# Patient Record
Sex: Male | Born: 1960 | Race: White | Hispanic: No | Marital: Single | Smoking: Never smoker
Health system: Southern US, Community
[De-identification: ages and names within clinical notes are randomized; demographics above are authoritative.]

## PROBLEM LIST (undated history)

## (undated) DIAGNOSIS — M549 Dorsalgia, unspecified: Secondary | ICD-10-CM

## (undated) DIAGNOSIS — M5126 Other intervertebral disc displacement, lumbar region: Secondary | ICD-10-CM

## (undated) DIAGNOSIS — M5136 Other intervertebral disc degeneration, lumbar region: Secondary | ICD-10-CM

## (undated) DIAGNOSIS — M51369 Other intervertebral disc degeneration, lumbar region without mention of lumbar back pain or lower extremity pain: Secondary | ICD-10-CM

## (undated) DIAGNOSIS — R55 Syncope and collapse: Secondary | ICD-10-CM

## (undated) DIAGNOSIS — F1410 Cocaine abuse, uncomplicated: Secondary | ICD-10-CM

## (undated) DIAGNOSIS — G8929 Other chronic pain: Secondary | ICD-10-CM

## (undated) DIAGNOSIS — S32009A Unspecified fracture of unspecified lumbar vertebra, initial encounter for closed fracture: Secondary | ICD-10-CM

## (undated) HISTORY — PX: CHOLECYSTECTOMY: SHX55

## (undated) HISTORY — PX: FRACTURE SURGERY: SHX138

## (undated) HISTORY — PX: ARM WOUND REPAIR / CLOSURE: SUR1141

## (undated) HISTORY — DX: Syncope and collapse: R55

---

## 2002-04-03 ENCOUNTER — Emergency Department (HOSPITAL_COMMUNITY): Admission: AC | Admit: 2002-04-03 | Discharge: 2002-04-03 | Payer: Self-pay

## 2002-04-03 ENCOUNTER — Encounter: Payer: Self-pay | Admitting: Emergency Medicine

## 2004-06-30 ENCOUNTER — Emergency Department: Payer: Self-pay | Admitting: General Practice

## 2004-07-10 ENCOUNTER — Ambulatory Visit: Payer: Self-pay | Admitting: Physician Assistant

## 2004-07-25 ENCOUNTER — Encounter: Payer: Self-pay | Admitting: Unknown Physician Specialty

## 2004-08-01 ENCOUNTER — Encounter: Payer: Self-pay | Admitting: Unknown Physician Specialty

## 2004-08-29 ENCOUNTER — Encounter: Payer: Self-pay | Admitting: Unknown Physician Specialty

## 2004-09-21 ENCOUNTER — Emergency Department: Payer: Self-pay | Admitting: Internal Medicine

## 2004-09-29 ENCOUNTER — Encounter: Payer: Self-pay | Admitting: Unknown Physician Specialty

## 2006-05-11 ENCOUNTER — Emergency Department: Payer: Self-pay | Admitting: Unknown Physician Specialty

## 2006-08-31 ENCOUNTER — Emergency Department: Payer: Self-pay | Admitting: Emergency Medicine

## 2008-04-07 ENCOUNTER — Emergency Department: Payer: Self-pay | Admitting: Emergency Medicine

## 2008-04-26 ENCOUNTER — Emergency Department: Payer: Self-pay | Admitting: Emergency Medicine

## 2008-05-10 ENCOUNTER — Ambulatory Visit: Payer: Self-pay | Admitting: Specialist

## 2008-10-06 ENCOUNTER — Emergency Department: Payer: Self-pay | Admitting: Emergency Medicine

## 2010-05-16 ENCOUNTER — Emergency Department: Payer: Self-pay | Admitting: Emergency Medicine

## 2010-05-26 ENCOUNTER — Emergency Department: Payer: Self-pay | Admitting: Unknown Physician Specialty

## 2010-06-21 ENCOUNTER — Emergency Department: Payer: Self-pay | Admitting: Emergency Medicine

## 2010-08-13 ENCOUNTER — Emergency Department: Payer: Self-pay | Admitting: Emergency Medicine

## 2010-08-22 ENCOUNTER — Ambulatory Visit: Payer: Self-pay | Admitting: Surgery

## 2010-08-24 ENCOUNTER — Ambulatory Visit: Payer: Self-pay | Admitting: Surgery

## 2010-08-28 LAB — PATHOLOGY REPORT

## 2010-11-11 ENCOUNTER — Emergency Department: Payer: Self-pay | Admitting: Emergency Medicine

## 2011-04-23 ENCOUNTER — Emergency Department: Payer: Self-pay | Admitting: Emergency Medicine

## 2011-06-01 ENCOUNTER — Emergency Department: Payer: Self-pay | Admitting: *Deleted

## 2011-07-21 ENCOUNTER — Emergency Department: Payer: Self-pay | Admitting: Emergency Medicine

## 2011-07-21 LAB — CBC
HCT: 42.4 % (ref 40.0–52.0)
HGB: 14.4 g/dL (ref 13.0–18.0)
MCH: 31.6 pg (ref 26.0–34.0)
MCHC: 33.9 g/dL (ref 32.0–36.0)
MCV: 93 fL (ref 80–100)
Platelet: 158 10*3/uL (ref 150–440)
RBC: 4.56 10*6/uL (ref 4.40–5.90)
RDW: 12.6 % (ref 11.5–14.5)
WBC: 6.4 10*3/uL (ref 3.8–10.6)

## 2011-07-21 LAB — BASIC METABOLIC PANEL
Anion Gap: 12 (ref 7–16)
BUN: 22 mg/dL — ABNORMAL HIGH (ref 7–18)
Calcium, Total: 8.9 mg/dL (ref 8.5–10.1)
Chloride: 107 mmol/L (ref 98–107)
Co2: 27 mmol/L (ref 21–32)
Creatinine: 1.31 mg/dL — ABNORMAL HIGH (ref 0.60–1.30)
EGFR (African American): >60
EGFR (Non-African Amer.): >60
Glucose: 117 mg/dL — ABNORMAL HIGH (ref 65–99)
Osmolality: 295 (ref 275–301)
Potassium: 4 mmol/L (ref 3.5–5.1)
Sodium: 146 mmol/L — ABNORMAL HIGH (ref 136–145)

## 2011-07-21 LAB — TROPONIN I
Troponin-I: <0.02 ng/mL
Troponin-I: <0.02 ng/mL

## 2012-04-14 ENCOUNTER — Emergency Department: Payer: Self-pay | Admitting: Emergency Medicine

## 2012-06-26 ENCOUNTER — Emergency Department: Payer: Self-pay | Admitting: Emergency Medicine

## 2012-09-21 ENCOUNTER — Emergency Department: Payer: Self-pay | Admitting: Emergency Medicine

## 2013-02-19 ENCOUNTER — Inpatient Hospital Stay: Payer: Self-pay | Admitting: Internal Medicine

## 2013-02-19 LAB — URINALYSIS, COMPLETE
Bacteria: NONE SEEN
Blood: NEGATIVE
Glucose,UR: NEGATIVE mg/dL (ref 0–75)
Hyaline Cast: 116
Ketone: NEGATIVE
Leukocyte Esterase: NEGATIVE
Nitrite: NEGATIVE
Ph: 5 (ref 4.5–8.0)
Protein: 25
RBC,UR: 12 /HPF (ref 0–5)
Specific Gravity: 1.03 (ref 1.003–1.030)
Squamous Epithelial: 4
WBC UR: 3 /HPF (ref 0–5)

## 2013-02-19 LAB — TROPONIN I: Troponin-I: <0.02 ng/mL

## 2013-02-19 LAB — COMPREHENSIVE METABOLIC PANEL
Albumin: 4.4 g/dL (ref 3.4–5.0)
Alkaline Phosphatase: 91 U/L (ref 50–136)
Anion Gap: 8 (ref 7–16)
BUN: 26 mg/dL — ABNORMAL HIGH (ref 7–18)
Bilirubin,Total: 0.7 mg/dL (ref 0.2–1.0)
Calcium, Total: 10 mg/dL (ref 8.5–10.1)
Chloride: 107 mmol/L (ref 98–107)
Co2: 22 mmol/L (ref 21–32)
Creatinine: 2.45 mg/dL — ABNORMAL HIGH (ref 0.60–1.30)
EGFR (African American): 34 — ABNORMAL LOW
EGFR (Non-African Amer.): 30 — ABNORMAL LOW
Glucose: 122 mg/dL — ABNORMAL HIGH (ref 65–99)
Osmolality: 280 (ref 275–301)
Potassium: 3.9 mmol/L (ref 3.5–5.1)
SGOT(AST): 71 U/L — ABNORMAL HIGH (ref 15–37)
SGPT (ALT): 115 U/L — ABNORMAL HIGH (ref 12–78)
Sodium: 137 mmol/L (ref 136–145)
Total Protein: 8.1 g/dL (ref 6.4–8.2)

## 2013-02-19 LAB — CK: CK, Total: 288 U/L — ABNORMAL HIGH (ref 35–232)

## 2013-02-19 LAB — CBC
HCT: 45.3 % (ref 40.0–52.0)
HGB: 15.6 g/dL (ref 13.0–18.0)
MCH: 32 pg (ref 26.0–34.0)
MCHC: 34.5 g/dL (ref 32.0–36.0)
MCV: 93 fL (ref 80–100)
Platelet: 175 10*3/uL (ref 150–440)
RBC: 4.88 10*6/uL (ref 4.40–5.90)
RDW: 12.5 % (ref 11.5–14.5)
WBC: 10.8 10*3/uL — ABNORMAL HIGH (ref 3.8–10.6)

## 2013-02-19 LAB — CREATININE, URINE, RANDOM: Creatinine, Urine Random: 399.1 mg/dL — ABNORMAL HIGH (ref 30.0–125.0)

## 2013-02-20 LAB — BASIC METABOLIC PANEL
BUN: 27 mg/dL — ABNORMAL HIGH (ref 7–18)
Calcium, Total: 8.7 mg/dL (ref 8.5–10.1)
Creatinine: 1.43 mg/dL — ABNORMAL HIGH (ref 0.60–1.30)
EGFR (African American): >60
Glucose: 99 mg/dL (ref 65–99)
Osmolality: 283 (ref 275–301)
Potassium: 3.8 mmol/L (ref 3.5–5.1)
Sodium: 139 mmol/L (ref 136–145)

## 2013-02-20 LAB — CBC WITH DIFFERENTIAL/PLATELET
Eosinophil #: 0.3 10*3/uL (ref 0.0–0.7)
Eosinophil %: 3.8 %
Lymphocyte #: 2.8 10*3/uL (ref 1.0–3.6)
Lymphocyte %: 42.1 %
MCH: 32.3 pg (ref 26.0–34.0)
MCHC: 34.6 g/dL (ref 32.0–36.0)
Platelet: 132 10*3/uL — ABNORMAL LOW (ref 150–440)
RBC: 4.29 10*6/uL — ABNORMAL LOW (ref 4.40–5.90)
WBC: 6.7 10*3/uL (ref 3.8–10.6)

## 2013-04-18 ENCOUNTER — Emergency Department: Payer: Self-pay | Admitting: Emergency Medicine

## 2013-04-18 LAB — PRO B NATRIURETIC PEPTIDE: B-Type Natriuretic Peptide: 15 pg/mL (ref 0–125)

## 2013-04-18 LAB — CBC
HCT: 40.2 % (ref 40.0–52.0)
MCH: 32.4 pg (ref 26.0–34.0)
MCHC: 34.4 g/dL (ref 32.0–36.0)
Platelet: 126 10*3/uL — ABNORMAL LOW (ref 150–440)
RDW: 12.9 % (ref 11.5–14.5)
WBC: 6.8 10*3/uL (ref 3.8–10.6)

## 2013-04-18 LAB — COMPREHENSIVE METABOLIC PANEL
Albumin: 3.4 g/dL (ref 3.4–5.0)
Anion Gap: 5 — ABNORMAL LOW (ref 7–16)
BUN: 18 mg/dL (ref 7–18)
Calcium, Total: 8.4 mg/dL — ABNORMAL LOW (ref 8.5–10.1)
Chloride: 110 mmol/L — ABNORMAL HIGH (ref 98–107)
Co2: 26 mmol/L (ref 21–32)
Creatinine: 1.49 mg/dL — ABNORMAL HIGH (ref 0.60–1.30)
EGFR (African American): >60
EGFR (Non-African Amer.): 54 — ABNORMAL LOW
Glucose: 182 mg/dL — ABNORMAL HIGH (ref 65–99)
Osmolality: 288 (ref 275–301)
SGPT (ALT): 73 U/L (ref 12–78)
Total Protein: 6.5 g/dL (ref 6.4–8.2)

## 2013-04-18 LAB — CK TOTAL AND CKMB (NOT AT ARMC): CK-MB: 1.1 ng/mL (ref 0.5–3.6)

## 2013-04-18 LAB — TROPONIN I: Troponin-I: <0.02 ng/mL

## 2013-05-02 ENCOUNTER — Emergency Department: Payer: Self-pay | Admitting: Emergency Medicine

## 2013-11-27 ENCOUNTER — Emergency Department: Payer: Self-pay | Admitting: Emergency Medicine

## 2014-01-06 ENCOUNTER — Emergency Department: Payer: Self-pay | Admitting: Emergency Medicine

## 2014-03-17 ENCOUNTER — Emergency Department: Payer: Self-pay | Admitting: Emergency Medicine

## 2014-03-20 ENCOUNTER — Emergency Department: Payer: Self-pay | Admitting: Emergency Medicine

## 2014-05-25 ENCOUNTER — Emergency Department: Payer: Self-pay | Admitting: Student

## 2014-08-16 ENCOUNTER — Emergency Department: Payer: Self-pay | Admitting: Internal Medicine

## 2014-10-03 ENCOUNTER — Emergency Department: Admit: 2014-10-03 | Disposition: A | Discharge status: Home / Self Care | Payer: Self-pay | Admitting: Student

## 2014-10-21 NOTE — H&P (Signed)
PATIENT NAME:  Randy Jordan, Randy Jordan MR#:  [id number] DATE OF BIRTH:  01/17/1961  DATE OF ADMISSION:  02/19/2013  PRIMARY CARE PHYSICIAN: None.   CHIEF COMPLAINT: Muscle cramps, fatigue.   HISTORY OF PRESENT ILLNESS: A 54 year old male patient with history of chronic back pain, on Percocet, who had been power washing a deck on the house today in the same. He mentioned that he drank 4 mugs of water all through the day, which was actually hot. He has barely produced any urine. Around 3:00 p.m., he had acute onset of muscle cramping all over. His back pain is the same, no change. The patient has been noticed to have mild elevation in CK of 288, with creatinine elevated at 2.45 with acute renal failure, and is being admitted to the hospitalist service. The patient does not mention any kidney disease in the past. The only medication he takes at home is Percocet p.r.n. for his back pain.   He does not have any diarrhea or vomiting. Has been hydrating himself well. His urinalysis shows 3 WBC but no bacteria. Does have hyaline casts. His last known creatinine was 1.3 in January 2013.   The patient did receive a dose of Toradol for his back pain prior to receiving the creatinine, and he has received 1 liter bolus of normal saline. He was also tachycardic at 110.   PAST MEDICAL HISTORY: Chronic back pain with back surgeries. Fracture of right humerus with surgery. Marijuana abuse on and off.   FAMILY HISTORY: Hypertension.   ALLERGIES: INCLUDE BENADRYL AND TRAMADOL, WHICH CAUSE RASH.   HOME MEDICATIONS: Percocet p.r.n.   REVIEW OF SYSTEMS:   CONSTITUTIONAL: Complains of fatigue. No weight loss, weight gain.  EYES: No blurred vision, pain, redness. EARS, NOSE, THROAT: No tinnitus, ear pain, hearing loss.  RESPIRATORY: No cough, wheeze, hemoptysis.  CARDIOVASCULAR: No chest pain, orthopnea, edema.  GASTROINTESTINAL: No nausea, vomiting, diarrhea, abdominal pain.  GENITOURINARY: No dysuria, hematuria.  Does have dark, decreased urine.  ENDOCRINE: No polyuria, nocturia, thyroid problems. HEMATOLOGIC AND LYMPHATIC: No anemia, easy bruising, bleeding.  INTEGUMENTARY: No acne, rash, lesions.  MUSCULOSKELETAL: Has chronic back pain and muscle cramps.  NEUROLOGIC: No focal numbness, weakness, seizures.  PSYCHIATRIC: No anxiety, depression.   SOCIAL HISTORY: The patient does not smoke. Uses on-and-off marijuana. Occasional alcohol, about 1 to 2 beers 3 times a week.   CODE STATUS: Full code.   PHYSICAL EXAMINATION: VITAL SIGNS: Temperature 98.2, pulse 110, blood pressure 148/102, saturating 100% on room air.  GENERAL: Obese Caucasian male patient sitting up in bed, comfortable, conversational, cooperative with exam.  PSYCHIATRIC: Alert, oriented x 3. Mood and affect appropriate. Judgment intact.  HEENT: Atraumatic, normocephalic. Oral mucosa dry and pink. No oral ulcers or thrush. External ears and nose normal. No pallor. No icterus. Pupils bilaterally equal and reactive to light.  NECK: Supple. No thyromegaly. No palpable lymph nodes. Trachea midline. No carotid bruit or JVD. HEART: Tachycardic.  RESPIRATORY: Normal work of breathing. Clear to auscultation on both sides.  GASTROINTESTINAL: Soft abdomen, nontender. Bowel sounds present. No hepatosplenomegaly palpable.  SKIN: Warm and dry. No petechiae, rash, ulcers.  GENITOURINARY: No CVA tenderness or bladder distention.  MUSCULOSKELETAL: Has tenderness in the lower back mid spinal area, with straight leg raising test negative on both sides.  NEUROLOGICAL: Motor strength 5/5 in upper and lower extremities. Sensation to fine touch intact all over.  LYMPHATIC: No cervical, supraclavicular lymphadenopathy.   LABORATORY, DIAGNOSTIC AND RADIOLOGICAL DATA: Glucose 122, BUN 26, creatinine 2.45,  sodium 137, potassium 3.9, GFR of 30. AST, ALT, alkaline phosphatase normal. CK of 288, troponin less than 0.02. WBC 10.8, hemoglobin 15.6, platelets of 175.  Urinalysis shows 3 WBC, no bacteria.   EKG shows normal sinus rhythm. No acute ST-T wave changes. Heart rate of 87.   Chest x-ray shows mild scarring of right base, mild atelectasis.   ASSESSMENT AND PLAN: 1.  Acute renal failure in a patient with severe dehydration who has been out in the sun all day long with minimal dehydration. Will admit the patient for aggressive IV fluid resuscitation. Will repeat labs tomorrow. Will check a urine sodium, protein and creatinine. In case the creatinine does not improve, he will need renal ultrasound and nephrology consult. This is secondary to acute tubular necrosis with dehydration. The patient's last known creatinine is 1.3. I suspect he has some baseline chronic kidney disease. Will avoid any nephrotoxic medications at this time.  2.  Muscle cramps. This is secondary to dehydration. Will repeat a CK to see if the mild elevation worsens.  3.  Chronic back pain, stable. Use as-needed Percocet.  4.  Deep vein thrombosis prophylaxis with heparin subcutaneously.   CODE STATUS: Full code.   TIME SPENT: Today on this case was 40 minutes.    ____________________________ Molinda BailiffSrikar R. Garen Woolbright, MD srs:jm D: 02/19/2013 19:14:36 ET T: 02/19/2013 19:50:56 ET JOB#: [id number]  cc: Wardell HeathSrikar R. Devin Ganaway, MD, <Dictator> Orie FishermanSRIKAR R Aija Scarfo MD ELECTRONICALLY SIGNED 02/19/2013 22:04
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
## 2014-10-25 ENCOUNTER — Emergency Department: Admit: 2014-10-25 | Disposition: A | Discharge status: Home / Self Care | Payer: Self-pay | Admitting: Internal Medicine

## 2014-11-24 ENCOUNTER — Emergency Department: Payer: Self-pay

## 2014-11-24 ENCOUNTER — Emergency Department
Admission: EM | Admit: 2014-11-24 | Discharge: 2014-11-24 | Disposition: A | Discharge status: Home / Self Care | Payer: Self-pay | Attending: Emergency Medicine | Admitting: Emergency Medicine

## 2014-11-24 DIAGNOSIS — Y9389 Activity, other specified: Secondary | ICD-10-CM | POA: Insufficient documentation

## 2014-11-24 DIAGNOSIS — W1789XA Other fall from one level to another, initial encounter: Secondary | ICD-10-CM | POA: Insufficient documentation

## 2014-11-24 DIAGNOSIS — Y9289 Other specified places as the place of occurrence of the external cause: Secondary | ICD-10-CM | POA: Insufficient documentation

## 2014-11-24 DIAGNOSIS — Y998 Other external cause status: Secondary | ICD-10-CM | POA: Insufficient documentation

## 2014-11-24 DIAGNOSIS — S93402A Sprain of unspecified ligament of left ankle, initial encounter: Secondary | ICD-10-CM | POA: Insufficient documentation

## 2014-11-24 DIAGNOSIS — S20212A Contusion of left front wall of thorax, initial encounter: Secondary | ICD-10-CM | POA: Insufficient documentation

## 2014-11-24 MED ORDER — KETOROLAC TROMETHAMINE 60 MG/2ML IM SOLN
60.0000 mg | Freq: Once | INTRAMUSCULAR | Status: AC
  Administered 2014-11-24: 30 mg via INTRAMUSCULAR

## 2014-11-24 MED ORDER — OXYCODONE-ACETAMINOPHEN 5-325 MG PO TABS
1.0000 | ORAL_TABLET | Freq: Once | ORAL | Status: AC
  Administered 2014-11-24: 1 via ORAL

## 2014-11-24 MED ORDER — OXYCODONE-ACETAMINOPHEN 5-325 MG PO TABS
ORAL_TABLET | ORAL | Status: AC
  Administered 2014-11-24: 1 via ORAL
  Filled 2014-11-24: qty 1

## 2014-11-24 MED ORDER — IBUPROFEN 800 MG PO TABS: 800.0000 mg | ORAL_TABLET | Freq: Three times a day (TID) | ORAL | Status: DC | PRN

## 2014-11-24 MED ORDER — KETOROLAC TROMETHAMINE 60 MG/2ML IM SOLN
INTRAMUSCULAR | Status: AC
  Administered 2014-11-24: 30 mg via INTRAMUSCULAR
  Filled 2014-11-24: qty 2

## 2014-11-24 MED ORDER — OXYCODONE-ACETAMINOPHEN 7.5-325 MG PO TABS: 1.0000 | ORAL_TABLET | ORAL | Status: DC | PRN

## 2014-11-24 NOTE — Discharge Instructions (Signed)
Ankle Sprain °An ankle sprain is an injury to the strong, fibrous tissues (ligaments) that hold the bones of your ankle joint together.  °CAUSES °An ankle sprain is usually caused by a fall or by twisting your ankle. Ankle sprains most commonly occur when you step on the outer edge of your foot, and your ankle turns inward. People who participate in sports are more prone to these types of injuries.  °SYMPTOMS  °· Pain in your ankle. The pain may be present at rest or only when you are trying to stand or walk. °· Swelling. °· Bruising. Bruising may develop immediately or within 1 to 2 days after your injury. °· Difficulty standing or walking, particularly when turning corners or changing directions. °DIAGNOSIS  °Your caregiver will ask you details about your injury and perform a physical exam of your ankle to determine if you have an ankle sprain. During the physical exam, your caregiver will press on and apply pressure to specific areas of your foot and ankle. Your caregiver will try to move your ankle in certain ways. An X-ray exam may be done to be sure a bone was not broken or a ligament did not separate from one of the bones in your ankle (avulsion fracture).  °TREATMENT  °Certain types of braces can help stabilize your ankle. Your caregiver can make a recommendation for this. Your caregiver may recommend the use of medicine for pain. If your sprain is severe, your caregiver may refer you to a surgeon who helps to restore function to parts of your skeletal system (orthopedist) or a physical therapist. °HOME CARE INSTRUCTIONS  °· Apply ice to your injury for 1-2 days or as directed by your caregiver. Applying ice helps to reduce inflammation and pain. °¨ Put ice in a plastic bag. °¨ Place a towel between your skin and the bag. °¨ Leave the ice on for 15-20 minutes at a time, every 2 hours while you are awake. °· Only take over-the-counter or prescription medicines for pain, discomfort, or fever as directed by  your caregiver. °· Elevate your injured ankle above the level of your heart as much as possible for 2-3 days. °· If your caregiver recommends crutches, use them as instructed. Gradually put weight on the affected ankle. Continue to use crutches or a cane until you can walk without feeling pain in your ankle. °· If you have a plaster splint, wear the splint as directed by your caregiver. Do not rest it on anything harder than a pillow for the first 24 hours. Do not put weight on it. Do not get it wet. You may take it off to take a shower or bath. °· You may have been given an elastic bandage to wear around your ankle to provide support. If the elastic bandage is too tight (you have numbness or tingling in your foot or your foot becomes cold and blue), adjust the bandage to make it comfortable. °· If you have an air splint, you may blow more air into it or let air out to make it more comfortable. You may take your splint off at night and before taking a shower or bath. Wiggle your toes in the splint several times per day to decrease swelling. °SEEK MEDICAL CARE IF:  °· You have rapidly increasing bruising or swelling. °· Your toes feel extremely cold or you lose feeling in your foot. °· Your pain is not relieved with medicine. °SEEK IMMEDIATE MEDICAL CARE IF: °· Your toes are numb or blue. °·   You have severe pain that is increasing. MAKE SURE YOU:   Understand these instructions.  Will watch your condition.  Will get help right away if you are not doing well or get worse. Document Released: 06/17/2005 Document Revised: 03/11/2012 Document Reviewed: 06/29/2011 [facility] Patient Information 2015 [place], [place]. This information is not intended to replace advice given to you by your health care provider. Make sure you discuss any questions you have with your health care provider.  Chest Contusion A chest contusion is a deep bruise on your chest area. Contusions are the result of an injury that caused bleeding  under the skin. A chest contusion may involve bruising of the skin, muscles, or ribs. The contusion may turn blue, purple, or yellow. Minor injuries will give you a painless contusion, but more severe contusions may stay painful and swollen for a few weeks. CAUSES  A contusion is usually caused by a blow, trauma, or direct force to an area of the body. SYMPTOMS   Swelling and redness of the injured area.  Discoloration of the injured area.  Tenderness and soreness of the injured area.  Pain. DIAGNOSIS  The diagnosis can be made by taking a history and performing a physical exam. An X-ray, CT scan, or MRI may be needed to determine if there were any associated injuries, such as broken bones (fractures) or internal injuries. TREATMENT  Often, the best treatment for a chest contusion is resting, icing, and applying cold compresses to the injured area. Deep breathing exercises may be recommended to reduce the risk of pneumonia. Over-the-counter medicines may also be recommended for pain control. HOME CARE INSTRUCTIONS   Put ice on the injured area.  Put ice in a plastic bag.  Place a towel between your skin and the bag.  Leave the ice on for 15-20 minutes, 03-04 times a day.  Only take over-the-counter or prescription medicines as directed by your caregiver. Your caregiver may recommend avoiding anti-inflammatory medicines (aspirin, ibuprofen, and naproxen) for 48 hours because these medicines may increase bruising.  Rest the injured area.  Perform deep-breathing exercises as directed by your caregiver.  Stop smoking if you smoke.  Do not lift objects over 5 pounds (2.3 kg) for 3 days or longer if recommended by your caregiver. SEEK IMMEDIATE MEDICAL CARE IF:   You have increased bruising or swelling.  You have pain that is getting worse.  You have difficulty breathing.  You have dizziness, weakness, or fainting.  You have blood in your urine or stool.  You cough up or  vomit blood.  Your swelling or pain is not relieved with medicines. MAKE SURE YOU:   Understand these instructions.  Will watch your condition.  Will get help right away if you are not doing well or get worse. Document Released: 03/12/2001 Document Revised: 03/11/2012 Document Reviewed: 12/09/2011 [facility] Patient Information 2015 [place], [place]. This information is not intended to replace advice given to you by your health care provider. Make sure you discuss any questions you have with your health care provider.   Take pain medicine as directed and follow up with the Orthopedist next week if ankle isn't improving.

## 2014-11-24 NOTE — ED Notes (Signed)
Ace wrap and Ice applied by Harvest DarkPam, Tech
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
## 2014-11-24 NOTE — ED Notes (Addendum)
Patient to ED with c/o left ankle and left rib pain. Reports he was standing on his porch, twisted his ankle and fell down to the ground hitting left side. Reports fell approximately 3 steps.

## 2014-12-02 ENCOUNTER — Emergency Department: Payer: Self-pay

## 2014-12-02 ENCOUNTER — Encounter: Payer: Self-pay | Admitting: Emergency Medicine

## 2014-12-02 ENCOUNTER — Emergency Department
Admission: EM | Admit: 2014-12-02 | Discharge: 2014-12-03 | Disposition: A | Discharge status: Home / Self Care | Payer: Self-pay | Attending: Emergency Medicine | Admitting: Emergency Medicine

## 2014-12-02 DIAGNOSIS — Y9389 Activity, other specified: Secondary | ICD-10-CM | POA: Insufficient documentation

## 2014-12-02 DIAGNOSIS — Y998 Other external cause status: Secondary | ICD-10-CM | POA: Insufficient documentation

## 2014-12-02 DIAGNOSIS — R0781 Pleurodynia: Secondary | ICD-10-CM

## 2014-12-02 DIAGNOSIS — X58XXXA Exposure to other specified factors, initial encounter: Secondary | ICD-10-CM | POA: Insufficient documentation

## 2014-12-02 DIAGNOSIS — S299XXA Unspecified injury of thorax, initial encounter: Secondary | ICD-10-CM | POA: Insufficient documentation

## 2014-12-02 DIAGNOSIS — Y9289 Other specified places as the place of occurrence of the external cause: Secondary | ICD-10-CM | POA: Insufficient documentation

## 2014-12-02 NOTE — ED Notes (Signed)
Patient states that he fell of his porch last week and was seen here and had some cracked ribs on the left. Patient states that he was coughing today and felt a rib pop on the left side.

## 2014-12-03 MED ORDER — OXYCODONE-ACETAMINOPHEN 5-325 MG PO TABS
2.0000 | ORAL_TABLET | Freq: Once | ORAL | Status: AC
  Administered 2014-12-03: 2 via ORAL

## 2014-12-03 MED ORDER — OXYCODONE-ACETAMINOPHEN 5-325 MG PO TABS
ORAL_TABLET | ORAL | Status: AC
  Filled 2014-12-03: qty 2

## 2014-12-03 NOTE — ED Provider Notes (Signed)
[facility] Emergency Department Provider Note  ____________________________________________  Time seen: 12:15 AM  I have reviewed the triage vital signs and the nursing notes.   HISTORY  Chief Complaint Pain      HPI Randy Jordan is a 54 y.o. male presents with history of fall last week with less chest rib injury. Patient states tonight he coughed and felt a pop in his left chest wall currently complaining of 9 out of 10 pain at that location area patient denies any dyspnea no fever.     No past medical history on file.  There are no active problems to display for this patient.   No past surgical history on file.  Current Outpatient Rx  Name  Route  Sig  Dispense  Refill  . ibuprofen (ADVIL,MOTRIN) 800 MG tablet   Oral   Take 1 tablet (800 mg total) by mouth every 8 (eight) hours as needed.   15 tablet   0   . oxyCODONE-acetaminophen (PERCOCET) 7.5-325 MG per tablet   Oral   Take 1 tablet by mouth every 4 (four) hours as needed for severe pain.   20 tablet   0     Allergies Benadryl and Tramadol  No family history on file.  Social History History  Substance Use Topics  . Smoking status: Never Smoker   . Smokeless tobacco: Not on file  . Alcohol Use: 1.2 oz/week    2 Cans of beer per week    Review of Systems  Constitutional: Negative for fever. Eyes: Negative for visual changes. ENT: Negative for sore throat. Cardiovascular: Positive for chest pain. Respiratory: Negative for shortness of breath. Gastrointestinal: Negative for abdominal pain, vomiting and diarrhea. Genitourinary: Negative for dysuria. Musculoskeletal: Negative for back pain. Skin: Negative for rash. Neurological: Negative for headaches, focal weakness or numbness.   10-point ROS otherwise negative.  ____________________________________________   PHYSICAL EXAM:  VITAL SIGNS: ED Triage Vitals  Enc Vitals Group     BP 12/02/14 2150 150/95  mmHg     Pulse Rate 12/02/14 2150 82     Resp 12/02/14 2150 20     Temp 12/02/14 2150 98.1 F (36.7 C)     Temp Source 12/02/14 2150 Oral     SpO2 12/02/14 2150 95 %     Weight 12/02/14 2150 240 lb (108.863 kg)     Height 12/02/14 2150 5\' 10"  (1.778 m)     Head Cir --      Peak Flow --      Pain Score 12/02/14 2151 10     Pain Loc --      Pain Edu? --      Excl. in GC? --      Constitutional: Alert and oriented. Well appearing and in no distress. Eyes: Conjunctivae are normal. PERRL. Normal extraocular movements. ENT   Head: Normocephalic and atraumatic.   Nose: No congestion/rhinnorhea.   Mouth/Throat: Mucous membranes are moist.   Neck: No stridor. Cardiovascular: Normal rate, regular rhythm. Normal and symmetric distal pulses are present in all extremities. No murmurs, rubs, or gallops. Pain no palpation left lateral sixth rib Respiratory: Normal respiratory effort without tachypnea nor retractions. Breath sounds are clear and equal bilaterally. No wheezes/rales/rhonchi. Gastrointestinal: Soft and nontender. No distention. There is no CVA tenderness. Genitourinary: deferred Musculoskeletal: Nontender with normal range of motion in all extremities. No joint effusions.  No lower extremity tenderness nor edema. Neurologic:  Normal speech and language. No gross focal neurologic deficits are  appreciated. Speech is normal.  Skin:  Skin is warm, dry and intact. No rash noted. Psychiatric: Mood and affect are normal. Speech and behavior are normal. Patient exhibits appropriate insight and judgment.  ____________________________________________       RADIOLOGY  Chest x-ray revealed no displaced rib fractures.  ____________________________________________      INITIAL IMPRESSION / ASSESSMENT AND PLAN / ED COURSE  Pertinent labs & imaging results that were available during my care of the patient were reviewed by me and considered in my medical decision making  (see chart for details).  History and physical exam consistent with possible left sixth rib injury  ____________________________________________   FINAL CLINICAL IMPRESSION(S) / ED DIAGNOSES  Final diagnoses:  Rib pain on left side      Darci Current, MD 12/03/14 0028

## 2014-12-03 NOTE — Discharge Instructions (Signed)

## 2014-12-18 ENCOUNTER — Emergency Department: Payer: Self-pay

## 2014-12-18 ENCOUNTER — Emergency Department
Admission: EM | Admit: 2014-12-18 | Discharge: 2014-12-18 | Disposition: A | Discharge status: Home / Self Care | Payer: Self-pay | Attending: Emergency Medicine | Admitting: Emergency Medicine

## 2014-12-18 DIAGNOSIS — Y9339 Activity, other involving climbing, rappelling and jumping off: Secondary | ICD-10-CM | POA: Insufficient documentation

## 2014-12-18 DIAGNOSIS — W010XXA Fall on same level from slipping, tripping and stumbling without subsequent striking against object, initial encounter: Secondary | ICD-10-CM | POA: Insufficient documentation

## 2014-12-18 DIAGNOSIS — Y9289 Other specified places as the place of occurrence of the external cause: Secondary | ICD-10-CM | POA: Insufficient documentation

## 2014-12-18 DIAGNOSIS — S7001XA Contusion of right hip, initial encounter: Secondary | ICD-10-CM | POA: Insufficient documentation

## 2014-12-18 DIAGNOSIS — M7071 Other bursitis of hip, right hip: Secondary | ICD-10-CM | POA: Insufficient documentation

## 2014-12-18 DIAGNOSIS — Y998 Other external cause status: Secondary | ICD-10-CM | POA: Insufficient documentation

## 2014-12-18 DIAGNOSIS — W19XXXA Unspecified fall, initial encounter: Secondary | ICD-10-CM

## 2014-12-18 MED ORDER — HYDROCODONE-ACETAMINOPHEN 5-325 MG PO TABS
ORAL_TABLET | ORAL | Status: DC
Start: 2014-12-18 — End: 2014-12-19
  Filled 2014-12-18: qty 1

## 2014-12-18 MED ORDER — KETOROLAC TROMETHAMINE 10 MG PO TABS: 10.0000 mg | ORAL_TABLET | Freq: Three times a day (TID) | ORAL | Status: DC

## 2014-12-18 MED ORDER — HYDROCODONE-ACETAMINOPHEN 5-325 MG PO TABS: 1.0000 | ORAL_TABLET | Freq: Four times a day (QID) | ORAL | Status: DC | PRN

## 2014-12-18 MED ORDER — HYDROCODONE-ACETAMINOPHEN 5-325 MG PO TABS
1.0000 | ORAL_TABLET | Freq: Once | ORAL | Status: AC
  Administered 2014-12-18: 1 via ORAL

## 2014-12-18 MED ORDER — HYDROCODONE-ACETAMINOPHEN 5-325 MG PO TABS: 1.0000 | ORAL_TABLET | Freq: Once | ORAL | Status: AC

## 2014-12-18 NOTE — ED Notes (Signed)
Pt presents to ER alert and in NAD. Pt states he slipped and fell on rocks. pt states left hip pain. Pt denies LOC.

## 2014-12-18 NOTE — Discharge Instructions (Signed)
Contusion A contusion is a deep bruise. Contusions are the result of an injury that caused bleeding under the skin. The contusion may turn blue, purple, or yellow. Minor injuries will give you a painless contusion, but more severe contusions may stay painful and swollen for a few weeks.  CAUSES  A contusion is usually caused by a blow, trauma, or direct force to an area of the body. SYMPTOMS   Swelling and redness of the injured area.  Bruising of the injured area.  Tenderness and soreness of the injured area.  Pain. DIAGNOSIS  The diagnosis can be made by taking a history and physical exam. An X-ray, CT scan, or MRI may be needed to determine if there were any associated injuries, such as fractures. TREATMENT  Specific treatment will depend on what area of the body was injured. In general, the best treatment for a contusion is resting, icing, elevating, and applying cold compresses to the injured area. Over-the-counter medicines may also be recommended for pain control. Ask your caregiver what the best treatment is for your contusion. HOME CARE INSTRUCTIONS   Put ice on the injured area.  Put ice in a plastic bag.  Place a towel between your skin and the bag.  Leave the ice on for 15-20 minutes, 3-4 times a day, or as directed by your health care provider.  Only take over-the-counter or prescription medicines for pain, discomfort, or fever as directed by your caregiver. Your caregiver may recommend avoiding anti-inflammatory medicines (aspirin, ibuprofen, and naproxen) for 48 hours because these medicines may increase bruising.  Rest the injured area.  If possible, elevate the injured area to reduce swelling. SEEK IMMEDIATE MEDICAL CARE IF:   You have increased bruising or swelling.  You have pain that is getting worse.  Your swelling or pain is not relieved with medicines. MAKE SURE YOU:   Understand these instructions.  Will watch your condition.  Will get help right  away if you are not doing well or get worse. Document Released: 03/27/2005 Document Revised: 06/22/2013 Document Reviewed: 04/22/2011 [facility] Patient Information 2015 [place], [place]. This information is not intended to replace advice given to you by your health care provider. Make sure you discuss any questions you have with your health care provider.   Hip Bursitis Bursitis is a swelling and soreness (inflammation) of a fluid-filled sac (bursa). This sac overlies and protects the joints.  CAUSES   Injury.  Overuse of the muscles surrounding the joint.  Arthritis.  Gout.  Infection.  Cold weather.  Inadequate warm-up and conditioning prior to activities. The cause may not be known.  SYMPTOMS   Mild to severe irritation.  Tenderness and swelling over the outside of the hip.  Pain with motion of the hip.  If the bursa becomes infected, a fever may be present. Redness, tenderness, and warmth will develop over the hip. Symptoms usually lessen in 3 to 4 weeks with treatment, but can come back. TREATMENT If conservative treatment does not work, your caregiver may advise draining the bursa and injecting cortisone into the area. This may speed up the healing process. This may also be used as an initial treatment of choice. HOME CARE INSTRUCTIONS   Apply ice to the affected area for 15-20 minutes every 3 to 4 hours while awake for the first 2 days. Put the ice in a plastic bag and place a towel between the bag of ice and your skin.  Rest the painful joint as much as possible, but continue  to put the joint through a normal range of motion at least 4 times per day. When the pain lessens, begin normal, slow movements and usual activities to help prevent stiffness of the hip.  Only take over-the-counter or prescription medicines for pain, discomfort, or fever as directed by your caregiver.  Use crutches to limit weight bearing on the hip joint, if advised.  Elevate your painful hip  to reduce swelling. Use pillows for propping and cushioning your legs and hips.  Gentle massage may provide comfort and decrease swelling. SEEK IMMEDIATE MEDICAL CARE IF:   Your pain increases even during treatment, or you are not improving.  You have a fever.  You have heat and inflammation over the involved bursa.  You have any other questions or concerns. MAKE SURE YOU:   Understand these instructions.  Will watch your condition.  Will get help right away if you are not doing well or get worse. Document Released: 12/07/2001 Document Revised: 09/09/2011 Document Reviewed: 07/06/2008 [facility] Patient Information 2015 [place], [place]. This information is not intended to replace advice given to you by your health care provider. Make sure you discuss any questions you have with your health care provider.  Your x-rays are negative for bony injury. You likely have a hip contusion and bursitis following your fall.  You should take the prescription meds as directed.  Apply ice to reduce swelling.  Follow-up with Dr. Hyacinth Meeker for further care of this injury.

## 2014-12-18 NOTE — ED Provider Notes (Signed)
[facility] Emergency Department Provider Note ____________________________________________  Time seen: 85  I have reviewed the triage vital signs and the nursing notes.  HISTORY  Chief Complaint  Fall  HPI Randy Jordan is a 54 y.o. male reports to the ED for evaluation and management of injury sustained after a slip and fall [place]. He describes he was jumping from rocks and slipped falling in his left hip on a rock and sustained also a small abrasion to the back of the head. He denies loss of consciousness, nausea, vomiting or any other injury. He is here primarily complaining of right hip pain and stiffness.  No past medical history on file.  There are no active problems to display for this patient.  No past surgical history on file.  Current Outpatient Rx  Name  Route  Sig  Dispense  Refill  . HYDROcodone-acetaminophen (NORCO) 5-325 MG per tablet   Oral   Take 1 tablet by mouth every 6 (six) hours as needed for moderate pain.   10 tablet   0   . ibuprofen (ADVIL,MOTRIN) 800 MG tablet   Oral   Take 1 tablet (800 mg total) by mouth every 8 (eight) hours as needed.   15 tablet   0   . ketorolac (TORADOL) 10 MG tablet   Oral   Take 1 tablet (10 mg total) by mouth every 8 (eight) hours.   15 tablet   0   . oxyCODONE-acetaminophen (PERCOCET) 7.5-325 MG per tablet   Oral   Take 1 tablet by mouth every 4 (four) hours as needed for severe pain.   20 tablet   0    Allergies Benadryl and Tramadol  No family history on file.  Social History History  Substance Use Topics  . Smoking status: Never Smoker   . Smokeless tobacco: Not on file  . Alcohol Use: 1.2 oz/week    2 Cans of beer per week   Review of Systems  Constitutional: Negative for fever. Eyes: Negative for visual changes. ENT: Negative for sore throat. Cardiovascular: Negative for chest pain. Respiratory: Negative for shortness of  breath. Gastrointestinal: Negative for abdominal pain, vomiting and diarrhea. Genitourinary: Negative for dysuria. Musculoskeletal: Negative for back pain. Right hip pain as above. Skin: Negative for rash. Neurological: Negative for headaches, focal weakness or numbness. ____________________________________________  PHYSICAL EXAM:  VITAL SIGNS: ED Triage Vitals  Enc Vitals Group     BP 12/18/14 2004 137/99 mmHg     Pulse Rate 12/18/14 2004 83     Resp 12/18/14 2004 20     Temp 12/18/14 2005 98 F (36.7 C)     Temp Source 12/18/14 2005 Oral     SpO2 12/18/14 2004 94 %     Weight 12/18/14 2004 240 lb (108.863 kg)     Height 12/18/14 2004  (1.778 m)     Head Cir --      Peak Flow --      Pain Score 12/18/14 2005 8     Pain Loc --      Pain Edu? --      Excl. in GC? --    Constitutional: Alert and oriented. Well appearing and in no distress. HEENT: Normocephalic and atraumatic. Conjunctivae are normal. PERRL. Normal extraocular movements. Cardiovascular: Normal rate, regular rhythm.  Respiratory: Normal respiratory effort. No wheezes/rales/rhonchi. Gastrointestinal: Soft and nontender. No distention. Musculoskeletal: Nontender with normal range of motion in RL extremity. Right hip  with linear superficial abrasions noted laterally. Tender to palp over the lateral buttock and hip trochanter.  Neurologic:  Normal gait without ataxia. Normal speech and language. No gross focal neurologic deficits are appreciated. Skin:  Skin is warm, dry and intact. No rash noted. Psychiatric: Mood and affect are normal. Patient exhibits appropriate insight and judgment. ____________________________________________   RADIOLOGY Left Hip  IMPRESSION: Negative exam. ____________________________________________  PROCEDURES  Norco 5/325 mg PO ____________________________________________  INITIAL IMPRESSION / ASSESSMENT AND PLAN / ED COURSE  Radiology results to patient. Mechanical slip &  fall with right hip contusion and bursitis.  Suggest follow-up with Dr. Hyacinth Meeker for continued care and bursa injection if needed. Prescription Toradol and Norco 5/325 #10 given. ____________________________________________  FINAL CLINICAL IMPRESSION(S) / ED DIAGNOSES  Final diagnoses:  Contusion, hip, right, initial encounter  Bursitis of hip, right     Lissa Hoard, PA-C 12/18/14 2232  Emily Filbert, MD 12/18/14 [phone]

## 2015-01-11 ENCOUNTER — Emergency Department
Admission: EM | Admit: 2015-01-11 | Discharge: 2015-01-11 | Disposition: A | Discharge status: Home / Self Care | Payer: Self-pay | Attending: Emergency Medicine | Admitting: Emergency Medicine

## 2015-01-11 ENCOUNTER — Encounter: Payer: Self-pay | Admitting: Emergency Medicine

## 2015-01-11 ENCOUNTER — Emergency Department: Payer: Self-pay

## 2015-01-11 DIAGNOSIS — Y9389 Activity, other specified: Secondary | ICD-10-CM | POA: Insufficient documentation

## 2015-01-11 DIAGNOSIS — W231XXA Caught, crushed, jammed, or pinched between stationary objects, initial encounter: Secondary | ICD-10-CM | POA: Insufficient documentation

## 2015-01-11 DIAGNOSIS — Y998 Other external cause status: Secondary | ICD-10-CM | POA: Insufficient documentation

## 2015-01-11 DIAGNOSIS — Y92009 Unspecified place in unspecified non-institutional (private) residence as the place of occurrence of the external cause: Secondary | ICD-10-CM | POA: Insufficient documentation

## 2015-01-11 DIAGNOSIS — M25531 Pain in right wrist: Secondary | ICD-10-CM

## 2015-01-11 DIAGNOSIS — S6991XA Unspecified injury of right wrist, hand and finger(s), initial encounter: Secondary | ICD-10-CM | POA: Insufficient documentation

## 2015-01-11 MED ORDER — OXYCODONE-ACETAMINOPHEN 5-325 MG PO TABS: 1.0000 | ORAL_TABLET | Freq: Three times a day (TID) | ORAL | Status: DC | PRN

## 2015-01-11 MED ORDER — IBUPROFEN 800 MG PO TABS
800.0000 mg | ORAL_TABLET | Freq: Once | ORAL | Status: AC
  Administered 2015-01-11: 800 mg via ORAL
  Filled 2015-01-11: qty 1

## 2015-01-11 MED ORDER — IBUPROFEN 800 MG PO TABS: 800.0000 mg | ORAL_TABLET | Freq: Three times a day (TID) | ORAL | Status: DC | PRN

## 2015-01-11 MED ORDER — OXYCODONE-ACETAMINOPHEN 5-325 MG PO TABS
1.0000 | ORAL_TABLET | Freq: Once | ORAL | Status: AC
  Administered 2015-01-11: 1 via ORAL
  Filled 2015-01-11: qty 1

## 2015-01-11 NOTE — ED Notes (Signed)
Pt reports right wrist shut in house door tonight, reports watch got broken. Pt reports pain to area. Strong pulse, good cap refill.

## 2015-01-11 NOTE — ED Provider Notes (Signed)
[facility] Emergency Department Provider Note  ____________________________________________  Time seen: Approximately 8:16 PM  I have reviewed the triage vital signs and the nursing notes.   HISTORY  Chief Complaint Wrist Pain   HPI Randy Jordan is a 54 y.o. male patient presents to the ER for complaints of right wrist pain. Patient states that prior to arrival patient was at his home and reports that his spouse accidentally shut the door on his wrist. Patient states that he has had continued pain to wrist since. Denies pain radiation or other injury. Denies fall, head injury or loss consciousness.  Patient states the pain is 10 out of 10 to right wrist. Patient states he still is able to grip objects with right hand. Denies numbness or tingling sensation. States pain is worse with rotation of wrist, and palpation to medial right wrist. Denies other injury.Patient denies headache, chest pain, dizziness, vision changes, nausea, vomiting or other complaints.   History reviewed. No pertinent past medical history.  There are no active problems to display for this patient.   History reviewed. No pertinent past surgical history.  Current Outpatient Rx  Name  Route  Sig  Dispense  Refill  .           .           .           .             Allergies Benadryl and Tramadol  No family history on file.  Social History History  Substance Use Topics  . Smoking status: Never Smoker   . Smokeless tobacco: Not on file  . Alcohol Use: 1.2 oz/week    2 Cans of beer per week    Review of Systems Constitutional: No fever/chills Eyes: No visual changes. ENT: No sore throat. Cardiovascular: Denies chest pain. Respiratory: Denies shortness of breath. Gastrointestinal: No abdominal pain.  No nausea, no vomiting.  No diarrhea.  No constipation. Genitourinary: Negative for dysuria. Musculoskeletal: Negative for back pain. Positive for right wrist  pain. Skin: Negative for rash. Neurological: Negative for headaches, focal weakness or numbness.  10-point ROS otherwise negative.  ____________________________________________   PHYSICAL EXAM:  VITAL SIGNS: ED Triage Vitals  Enc Vitals Group     BP 01/11/15 1828 161/106 mmHg     Pulse Rate 01/11/15 1828 83     Resp 01/11/15 1828 19     Temp 01/11/15 1828 98.6 F (37 C)     Temp src --      SpO2 01/11/15 1828 96 %     Weight 01/11/15 1828 240 lb (108.863 kg)     Height 01/11/15 1828 5\' 9"  (1.753 m)     Head Cir --      Peak Flow --      Pain Score 01/11/15 1829 10     Pain Loc --      Pain Edu? --      Excl. in GC? --    Blood pressure 151/104, pulse 74, temperature 97.8 F (36.6 C), temperature source Oral, resp. rate 18, height 5\' 9"  (1.753 m), weight 240 lb (108.863 kg), SpO2 98 %.   Constitutional: Alert and oriented. Well appearing and in no acute distress. Eyes: Conjunctivae are normal. PERRL. EOMI. Head: Atraumatic. Nose: No congestion/rhinnorhea. Mouth/Throat: Mucous membranes are moist.  Oropharynx non-erythematous. Neck: No stridor.  No cervical spine tenderness to palpation. Hematological/Lymphatic/Immunilogical: No cervical lymphadenopathy. Cardiovascular: Normal rate, regular rhythm. Grossly normal  heart sounds.  Good peripheral circulation. Respiratory: Normal respiratory effort.  No retractions. Lungs CTAB. Gastrointestinal: Soft and nontender. No distention. No abdominal bruits. No CVA tenderness. Musculoskeletal: No lower or upper extremity tenderness nor edema.  No joint effusions. No cervical, thoracic or lumbar tenderness to palpation. Except: right distal radius and mid wrist mod TTP with mild swelling, pain in anatomical snuff box, pain with thumb resistance of flexion and extension. No motor or tendon deficit. Full ROm but pain with rotation.  Neurologic:  Normal speech and language. No gross focal neurologic deficits are appreciated. No gait  instability. Skin:  Skin is warm, dry and intact. No rash noted. Psychiatric: Mood and affect are normal. Speech and behavior are normal.  ____________________________________________   LABS (all labs ordered are listed, but only abnormal results are displayed)  Labs Reviewed - No data to display  RADIOLOGY  RIGHT WRIST - COMPLETE 3+ VIEW  COMPARISON: Prior radiographs of the right hand 10/25/2014  FINDINGS: No evidence of acute fracture or malalignment. Degenerative change present at the distal radioulnar joint and the STT joint. Subchondral cyst formation within the scaphoid, similar compared to prior.  IMPRESSION: 1. No acute fracture or malalignment. 2. 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 3. Degenerative change present at the distal radioulnar joint. 4. Subchondral cyst versus in geode in the mid aspect of the scaphoid, similar compared to prior.   Electronically Signed By: Malachy Moan M.D. On: 01/11/2015 19:11  I, Renford Dills, personally viewed and evaluated these images as part of my medical decision making.   ____________________________________________   PROCEDURES  Procedure(s) performed:  SPLINT APPLICATION Date/Time: 9:29 PM Authorized by: Renford Dills Consent: Verbal consent obtained. Risks and benefits: risks, benefits and alternatives were discussed Consent given by: patient Splint applied by: edtechnician Location details: right wrist Splint type: thumb spica Supplies used: ocl and sling Post-procedure: The splinted body part was neurovascularly unchanged following the procedure. Patient tolerance: Patient tolerated the procedure well with no immediate complications.   __________________________   INITIAL IMPRESSION / ASSESSMENT AND PLAN / ED COURSE  Pertinent labs & imaging results that were available during my care of the patient were reviewed by me and considered in my medical decision making (see chart for  details).  No acute distress. Very well-appearing patient. Discussed the patient to monitor her pressure at home. Patient reports the blood pressure elevated due to pain. Follow up with primary care physician regarding blood pressure. Presents the ER for the complaints of right wrist pain post injury. X-ray negative for acute fracture or malalignment. Patient however with anatomical snuffbox tenderness concern for scaphoid/carpal injury. Will splint with thumb spica, ice, elevation and follow up with orthopedic in one week. Patient and spouse verbalized understanding and agreed to plan. Patient to follow-up with orthopedic. ____________________________________________   FINAL CLINICAL IMPRESSION(S) / ED DIAGNOSES  Final diagnoses:  Wrist pain, acute, right      Renford Dills, NP 01/11/15 2130  Minna Antis, MD 01/11/15 2141

## 2015-01-11 NOTE — Discharge Instructions (Signed)
Take medication as prescribed. Apply ice and elevate. Keep wrist in splint and follow up with orthopedic next week. See above to call to schedule.   Return to ER for new or worsening concerns.   Wrist Pain Wrist injuries are frequent in adults and children. A sprain is an injury to the ligaments that hold your bones together. A strain is an injury to muscle or muscle cord-like structures (tendons) from stretching or pulling. Generally, when wrists are moderately tender to touch following a fall or injury, a break in the bone (fracture) may be present. Most wrist sprains or strains are better in 3 to 5 days, but complete healing may take several weeks. HOME CARE INSTRUCTIONS   Put ice on the injured area.  Put ice in a plastic bag.  Place a towel between your skin and the bag.  Leave the ice on for 15-20 minutes, 3-4 times a day, for the first 2 days, or as directed by your health care provider.  Keep your arm raised above the level of your heart whenever possible to reduce swelling and pain.  Rest the injured area for at least 48 hours or as directed by your health care provider.  If a splint or elastic bandage has been applied, use it for as long as directed by your health care provider or until seen by a health care provider for a follow-up exam.  Only take over-the-counter or prescription medicines for pain, discomfort, or fever as directed by your health care provider.  Keep all follow-up appointments. You may need to follow up with a specialist or have follow-up X-rays. Improvement in pain level is not a guarantee that you did not fracture a bone in your wrist. The only way to determine whether or not you have a broken bone is by X-ray. SEEK IMMEDIATE MEDICAL CARE IF:   Your fingers are swollen, very red, white, or cold and blue.  Your fingers are numb or tingling.  You have increasing pain.  You have difficulty moving your fingers. MAKE SURE YOU:   Understand these  instructions.  Will watch your condition.  Will get help right away if you are not doing well or get worse. Document Released: 03/27/2005 Document Revised: 06/22/2013 Document Reviewed: 08/08/2010 [facility] Patient Information 2015 [place]. This information is not intended to replace advice given to you by your health care provider. Make sure you discuss any questions you have with your health care provider.

## 2015-03-20 ENCOUNTER — Encounter: Payer: Self-pay | Admitting: Emergency Medicine

## 2015-03-20 ENCOUNTER — Emergency Department
Admission: EM | Admit: 2015-03-20 | Discharge: 2015-03-20 | Disposition: A | Discharge status: Home / Self Care | Payer: Self-pay | Attending: Emergency Medicine | Admitting: Emergency Medicine

## 2015-03-20 DIAGNOSIS — M5126 Other intervertebral disc displacement, lumbar region: Secondary | ICD-10-CM | POA: Insufficient documentation

## 2015-03-20 DIAGNOSIS — M5136 Other intervertebral disc degeneration, lumbar region: Secondary | ICD-10-CM

## 2015-03-20 HISTORY — DX: Other intervertebral disc degeneration, lumbar region without mention of lumbar back pain or lower extremity pain: M51.369

## 2015-03-20 HISTORY — DX: Other intervertebral disc degeneration, lumbar region: M51.36

## 2015-03-20 HISTORY — DX: Other intervertebral disc displacement, lumbar region: M51.26

## 2015-03-20 HISTORY — DX: Unspecified fracture of unspecified lumbar vertebra, initial encounter for closed fracture: S32.009A

## 2015-03-20 MED ORDER — MELOXICAM 15 MG PO TABS: 15.0000 mg | ORAL_TABLET | Freq: Every day | ORAL | Status: DC

## 2015-03-20 MED ORDER — OXYCODONE-ACETAMINOPHEN 5-325 MG PO TABS: 1.0000 | ORAL_TABLET | Freq: Four times a day (QID) | ORAL | Status: DC | PRN

## 2015-03-20 NOTE — ED Provider Notes (Signed)
[facility] Emergency Department Provider Note  ____________________________________________  Time seen: Approximately 3:52 PM  I have reviewed the triage vital signs and the nursing notes.   HISTORY  Chief Complaint Back Pain    HPI COLT MARTELLE is a 54 y.o. male who presents to the emergency room with lower back pain. He states that he has had a bulging disc for approximately 7 years and states that he has "flareups" 1-2 times a year.He is typically treated with anti-inflammatories and pain medication. He states that he should have surgery for same but states that he does not have insurance. The pain radiates down left leg but denies any numbness or tingling in toes. Pain is constant, moderate to severe, worse with movement.   Past Medical History  Diagnosis Date  . Bulging lumbar disc   . Lumbar vertebral fracture     There are no active problems to display for this patient.   Past Surgical History  Procedure Laterality Date  . Fracture surgery      Right arm    Current Outpatient Rx  Name  Route  Sig  Dispense  Refill  . ibuprofen (ADVIL,MOTRIN) 800 MG tablet   Oral   Take 1 tablet (800 mg total) by mouth every 8 (eight) hours as needed for mild pain or moderate pain.   15 tablet   0   . meloxicam (MOBIC) 15 MG tablet   Oral   Take 1 tablet (15 mg total) by mouth daily.   30 tablet   0   . oxyCODONE-acetaminophen (ROXICET) 5-325 MG per tablet   Oral   Take 1 tablet by mouth every 6 (six) hours as needed for severe pain.   20 tablet   0     Allergies Benadryl and Tramadol  No family history on file.  Social History Social History  Substance Use Topics  . Smoking status: Never Smoker   . Smokeless tobacco: None  . Alcohol Use: No    Review of Systems Constitutional: No fever/chills Eyes: No visual changes. ENT: No sore throat. Cardiovascular: Denies chest pain. Respiratory: Denies shortness of  breath. Gastrointestinal: No abdominal pain.  No nausea, no vomiting.  No diarrhea.  No constipation. Genitourinary: Negative for dysuria. Musculoskeletal: Positive for back pain that radiates down left leg Skin: Negative for rash. Neurological: Negative for headaches, focal weakness or numbness.  10-point ROS otherwise negative.  ____________________________________________   PHYSICAL EXAM:  VITAL SIGNS: ED Triage Vitals  Enc Vitals Group     BP 03/20/15 1507 131/91 mmHg     Pulse Rate 03/20/15 1507 96     Resp 03/20/15 1507 18     Temp 03/20/15 1507 97.6 F (36.4 C)     Temp Source 03/20/15 1507 Oral     SpO2 03/20/15 1507 97 %     Weight 03/20/15 1507 240 lb (108.863 kg)     Height 03/20/15 1507  (1.753 m)     Head Cir --      Peak Flow --      Pain Score 03/20/15 1507 9     Pain Loc --      Pain Edu? --      Excl. in GC? --     Constitutional: Alert and oriented. Well appearing and in no acute distress. Eyes: Conjunctivae are normal. PERRL. EOMI. Head: Atraumatic. Nose: No congestion/rhinnorhea. Mouth/Throat: Mucous membranes are moist.  Oropharynx non-erythematous. Neck: No stridor.   Cardiovascular: Normal rate, regular rhythm.  Grossly normal heart sounds.  Good peripheral circulation. Respiratory: Normal respiratory effort.  No retractions. Lungs CTAB. Gastrointestinal: Soft and nontender. No distention. No abdominal bruits. No CVA tenderness. Musculoskeletal: No lower extremity tenderness nor edema.  No joint effusions. Tenderness to palpation over paraspinal muscles in left lumbar region. No point tenderness. Positive straight leg lift on left side. Full range of motion of left lower extremity. Equal strength in lower extremities. Neurologic:  Normal speech and language. No gross focal neurologic deficits are appreciated. No gait instability. Skin:  Skin is warm, dry and intact. No rash noted. Psychiatric: Mood and affect are normal. Speech and behavior are  normal.  ____________________________________________   LABS (all labs ordered are listed, but only abnormal results are displayed)  Labs Reviewed - No data to display ____________________________________________  EKG   ____________________________________________  RADIOLOGY   ____________________________________________   PROCEDURES  Procedure(s) performed: None  Critical Care performed: No  ____________________________________________   INITIAL IMPRESSION / ASSESSMENT AND PLAN / ED COURSE  Pertinent labs & imaging results that were available during my care of the patient were reviewed by me and considered in my medical decision making (see chart for details).  Patient has a known history of lower back pain and bulging disc. States that symptoms are the same. Exam is consistent with the same. Treating patient with anti-inflammatories and pain medication. Patient follow-up with orthopedics. Patient understands and voices complaints.  ____________________________________________   FINAL CLINICAL IMPRESSION(S) / ED DIAGNOSES  Final diagnoses:  Bulging lumbar disc      Racheal Patches, PA-C 03/20/15 1632  Arnaldo Natal, MD 03/23/15 1719

## 2015-03-20 NOTE — ED Notes (Signed)
Patient to ER for lower back pain. States he knows he has a bulging disc, states feels the same as last episode. Reports having pain shooting down leg as well.

## 2015-03-20 NOTE — Discharge Instructions (Signed)
Anterior and Posterior Decompressions with Fusions °Decompression is a procedure performed to relieve symptoms caused by pressure or compression on the spinal cord and nerve roots. The spinal cord is a cord of nervous tissue. It extends from the brain and passes through the spinal canal (passage formed by the openings in the backbone). Nerves branch out from the spinal cord to different parts of the body. Vertebrae (backbones) are a group of individual bones. They form the spinal column. Each vertebra is made up of lamina (bony arches of the spinal canal), spine, and foramen (opening in the backbone). A disc is a soft, gel-like cushion between each vertebra.  °Decompression can be carried out as an anterior or posterior procedure. Anterior decompression is where the surgeon makes an incision more toward the front of your body in order to get access to the area needing repair. Posterior decompression is where the surgeon makes an incision more toward the back of your body in order to get access to the area needing repair. Depending upon the details of your specific condition, there are reasons why the surgeon may choose one approach over the other. °CAUSES °Compression of spinal cord and nerves can be caused by: °· Bulging or collapse of the spinal disc. °· Loosening of the ligaments. °· Bony growth. °The causes of the spinal cord compression include: °· Degenerative diseases (such as with many arthritis problems). °· Rupture of the disc. °· Traumatic injury. °· Spinal stenosis (narrowing of the spinal canal). °· Pott disease (tuberculosis of the spine). °LET YOUR CAREGIVER KNOW ABOUT:  °· Bleeding and clotting problems. °· Allergy to anesthesia medications. °· Allergy to medications like steroid and blood thinners. °· Previous surgeries. °· Bone diseases like disease of low bone mass (osteoporosis) and infection of bone (osteomyelitis). °· Cancerous (neoplastic) conditions. °RISKS AND COMPLICATIONS °· Bleeding and  collection of blood outside the blood vessels. °· Damage to the blood vessels. This can cause heavy bleeding and even death. °· Damage to the nerves. This can cause pain, loss of sensation, paralysis, and weakness. °· Damage to the covering of the spinal cord (meninges). This can cause the cerebrospinal fluid to leak. °· Complications involving graft and plate. °· Inadequate fusion. °· Wound infection. °BEFORE THE PROCEDURE  °Your caregiver will make sure that you have been given a reasonable trial of nonsurgical treatment methods. Your caregiver may perform an X-ray study with a dye (diskogram). The injection of a dye into a disc may produce a pain similar to your back pain. This will help your caregiver to identify the disc that is the source of pain.  °PROCEDURE °Your surgeon will perform spinal decompression and fusion while you are under general anesthesia. There are different methods of performing decompression surgery. They include the following: °· Diskectomy. Your surgeon will remove a portion of a spinal disc that is causing pressure on the nerve roots. °· Laminotomy or laminectomy. Lamina refers to bony arches of the spinal canal. Laminotomy is a procedure in which your surgeon removes a small portion of lamina. Laminectomy is the removal of an entire lamina. °· Foraminotomy or foraminectomy. Foramen refers to the opening in the backbone for nerve roots. Foraminotomy is the removal of a small amount of bone and tissue forming the foramen. Foraminectomy is the removal of a large amount of bone and tissue. °· Osteophyte removal. Your surgeon removes bony growths called osteophytes. These cause pressure on the spinal column. °· Corpectomy. Corpus refers to the body of vertebra. This procedure involves   the removal of the body of a vertebra and also the discs. °· Spinal fusion is required if there is a curvature of the spine or forward slip of a vertebra. Spinal fusion is a procedure of fusing two or more  vertebrae together with a bone graft (new bone or substitute material from your body). This is further strengthened by using screws, plate, and cage apparatus. °¨ Fusion prevents motion between 2 vertebrae. It also prevents the curvature of the spine and slip of vertebra from getting worse after surgery. °AFTER THE PROCEDURE  °· You will be moved to the recovery room and then to the hospital room. °· You will have a thin tube (catheter) inserted into the urinary bladder to help in urination. °· Your may have pain for the first few days after the surgery. Your caregiver will give you medications to control pain. °· Your caregiver may order blood tests to monitor oxygen carrying protein of the red blood cells (hemoglobin). This would be due to blood loss during the surgery. Hemoglobin should be monitored to make sure that the level of blood oxygen does not become too low. °· You will be given a back brace. A back brace limits motion and helps fusion of the bone. °· You may continue to have mild pain even after full recovery. °· You may have a series X-ray examinations over time. This will ensure adequate healing and appropriate alignment at the site of operation. °HOME CARE INSTRUCTIONS  °· To get strength and function, start physical therapy, occupational therapy, and exercises. °· To ease the pain, you may have to exercise regularly. That also helps in weight loss. °· To keep your spine in proper alignment, you should sit, stand, walk, turn in bed, and reposition yourself as instructed. °· At first, take only short walks. Slowly increase other activities. °· Avoid smoking. Nicotine inhibits fusion of the bone. °· If narcotics (pain medication) are prescribed, you should not drink alcohol. You should not drive when you are on this medication because you will feel drowsy. °· Avoid use of pain medication products and nonsteroidal anti-inflammatory agents (pain medication). They interfere with the development and growth  of new bone cells. °· Your caregiver may recommend using ice to manage pain. Ask your caregiver for instructions on how to do it. °· Avoid lifting heavy objects. °· Avoid bending and twisting motions. °SEEK MEDICAL CARE IF:  °· You have increased pain. °· There is expanding redness at the operative site. °· You have a fever. °SEEK IMMEDIATE MEDICAL CARE IF:  °· You have any discomfort with the substitute material that has been used during the spinal fusion procedure. °· You feel a collection of blood outside the blood vessels. °· You notice any changes in the smell, appearance, or amount of drainage. °Document Released: 07/07/2007 Document Revised: 11/01/2013 Document Reviewed: 11/07/2007 °[employer]® Patient Information ©2015 [employer]. This information is not intended to replace advice given to you by your health care provider. Make sure you discuss any questions you have with your health care provider. ° °

## 2015-06-08 ENCOUNTER — Emergency Department: Payer: Self-pay

## 2015-06-08 ENCOUNTER — Emergency Department
Admission: EM | Admit: 2015-06-08 | Discharge: 2015-06-08 | Disposition: A | Discharge status: Home / Self Care | Payer: Self-pay | Attending: Emergency Medicine | Admitting: Emergency Medicine

## 2015-06-08 ENCOUNTER — Encounter: Payer: Self-pay | Admitting: Emergency Medicine

## 2015-06-08 DIAGNOSIS — M545 Low back pain, unspecified: Secondary | ICD-10-CM

## 2015-06-08 DIAGNOSIS — S8992XA Unspecified injury of left lower leg, initial encounter: Secondary | ICD-10-CM | POA: Insufficient documentation

## 2015-06-08 DIAGNOSIS — Y9389 Activity, other specified: Secondary | ICD-10-CM | POA: Insufficient documentation

## 2015-06-08 DIAGNOSIS — G8929 Other chronic pain: Secondary | ICD-10-CM | POA: Insufficient documentation

## 2015-06-08 DIAGNOSIS — W01198A Fall on same level from slipping, tripping and stumbling with subsequent striking against other object, initial encounter: Secondary | ICD-10-CM | POA: Insufficient documentation

## 2015-06-08 DIAGNOSIS — Y998 Other external cause status: Secondary | ICD-10-CM | POA: Insufficient documentation

## 2015-06-08 DIAGNOSIS — S3992XA Unspecified injury of lower back, initial encounter: Secondary | ICD-10-CM | POA: Insufficient documentation

## 2015-06-08 DIAGNOSIS — Y92512 Supermarket, store or market as the place of occurrence of the external cause: Secondary | ICD-10-CM | POA: Insufficient documentation

## 2015-06-08 MED ORDER — NAPROXEN 500 MG PO TABS: 500.0000 mg | ORAL_TABLET | Freq: Two times a day (BID) | ORAL | Status: DC

## 2015-06-08 MED ORDER — OXYCODONE HCL 5 MG PO TABS
5.0000 mg | ORAL_TABLET | Freq: Once | ORAL | Status: AC
  Administered 2015-06-08: 5 mg via ORAL

## 2015-06-08 MED ORDER — OXYCODONE HCL 5 MG PO TABS
ORAL_TABLET | ORAL | Status: AC
  Administered 2015-06-08: 5 mg via ORAL
  Filled 2015-06-08: qty 1

## 2015-06-08 MED ORDER — NAPROXEN 500 MG PO TABS
ORAL_TABLET | ORAL | Status: AC
  Administered 2015-06-08: 500 mg via ORAL
  Filled 2015-06-08: qty 1

## 2015-06-08 MED ORDER — NAPROXEN 500 MG PO TABS
500.0000 mg | ORAL_TABLET | Freq: Once | ORAL | Status: AC
  Administered 2015-06-08: 500 mg via ORAL

## 2015-06-08 MED ORDER — OXYCODONE-ACETAMINOPHEN 5-325 MG PO TABS: 1.0000 | ORAL_TABLET | Freq: Four times a day (QID) | ORAL | Status: DC | PRN

## 2015-06-08 NOTE — ED Notes (Signed)
Patient states that he slipped on water at [employer] yesterday and fell. Patient with complaint of lower back pain and left knee pain.

## 2015-06-08 NOTE — ED Notes (Signed)
Pt. Going home with mother. 

## 2015-06-08 NOTE — Discharge Instructions (Signed)

## 2015-06-08 NOTE — ED Provider Notes (Signed)
[facility] Emergency Department Provider Note ____________________________________________  Time seen: Approximately 7:57 PM  I have reviewed the triage vital signs and the nursing notes.   HISTORY  Chief Complaint Fall; Knee Pain; and Back Pain    HPI Randy Jordan is a 54 y.o. male who presents to the emergency department for evaluation of left knee pain and lower back pain after slipping and water at [employer] yesterday. He states that he fell and landed directly on the left knee. Since that time his chronic back pain has "flared up." He also has pain under his kneecap. He has taken ibuprofen and Tylenol without relief.   Past Medical History  Diagnosis Date  . Bulging lumbar disc   . Lumbar vertebral fracture (HCC)     There are no active problems to display for this patient.   Past Surgical History  Procedure Laterality Date  . Fracture surgery      Right arm    Current Outpatient Rx  Name  Route  Sig  Dispense  Refill  . naproxen (NAPROSYN) 500 MG tablet   Oral   Take 1 tablet (500 mg total) by mouth 2 (two) times daily with a meal.   60 tablet   0   . oxyCODONE-acetaminophen (ROXICET) 5-325 MG tablet   Oral   Take 1 tablet by mouth every 6 (six) hours as needed.   9 tablet   0     Allergies Benadryl and Tramadol  No family history on file.  Social History Social History  Substance Use Topics  . Smoking status: Never Smoker   . Smokeless tobacco: None  . Alcohol Use: Yes     Comment: occaisonly    Review of Systems Constitutional: No recent illness. Eyes: No visual changes. ENT: No sore throat. Cardiovascular: Denies chest pain or palpitations. Respiratory: Denies shortness of breath. Gastrointestinal: No abdominal pain.  Genitourinary: Negative for dysuria. Musculoskeletal: Pain in the lumbar spine and left knee. Skin: Negative for rash. Neurological: Negative for headaches, focal weakness or  numbness. 10-point ROS otherwise negative.  ____________________________________________   PHYSICAL EXAM:  VITAL SIGNS: ED Triage Vitals  Enc Vitals Group     BP 06/08/15 1928 179/97 mmHg     Pulse Rate 06/08/15 1928 83     Resp 06/08/15 1928 20     Temp 06/08/15 1928 98.1 F (36.7 C)     Temp Source 06/08/15 1928 Oral     SpO2 06/08/15 1928 97 %     Weight 06/08/15 1928 251 lb (113.853 kg)     Height 06/08/15 1928  (1.753 m)     Head Cir --      Peak Flow --      Pain Score 06/08/15 1929 9     Pain Loc --      Pain Edu? --      Excl. in GC? --     Constitutional: Alert and oriented. Well appearing and in no acute distress. Eyes: Conjunctivae are normal. EOMI. Head: Atraumatic. Nose: No congestion/rhinnorhea. Neck: No stridor.  Respiratory: Normal respiratory effort.   Musculoskeletal: Tenderness to the left anterior knee. There is pain on varus and valgus stressors. Active straight leg raise is possible but painful. Diffuse tenderness over the lumbar spine without focal midline tenderness. Neurologic:  Normal speech and language. No gross focal neurologic deficits are appreciated. Speech is normal. No gait instability. Skin:  Skin is warm, dry and intact. Atraumatic. Psychiatric: Mood and affect are  normal. Speech and behavior are normal.  ____________________________________________   LABS (all labs ordered are listed, but only abnormal results are displayed)  Labs Reviewed - No data to display ____________________________________________  RADIOLOGY  Left knee negative for acute bony abnormality or effusion per radiology. ____________________________________________   PROCEDURES  Procedure(s) performed:   Knee immobilizer was applied to the left knee. Neurovascularly intact post-application   ____________________________________________   INITIAL IMPRESSION / ASSESSMENT AND PLAN / ED COURSE  Pertinent labs & imaging results that were available  during my care of the patient were reviewed by me and considered in my medical decision making (see chart for details).  Patient was advised to follow-up with orthopedics for symptoms that are not improving over the next 7 days. He was advised to return to the emergency department for symptoms that change or worsen if he is unable to schedule an appointment. ____________________________________________   FINAL CLINICAL IMPRESSION(S) / ED DIAGNOSES  Final diagnoses:  Knee injury, left, initial encounter  Acute lumbar back pain       Chinita PesterCari B Kyuss Hale, FNP 06/08/15 2134  Minna AntisKevin Paduchowski, MD 06/08/15 2209

## 2015-06-22 ENCOUNTER — Other Ambulatory Visit: Payer: Self-pay | Admitting: Student

## 2015-06-22 DIAGNOSIS — M25562 Pain in left knee: Secondary | ICD-10-CM

## 2015-07-14 ENCOUNTER — Ambulatory Visit: Admission: RE | Admit: 2015-07-14 | Payer: Self-pay | Source: Ambulatory Visit

## 2015-07-19 ENCOUNTER — Ambulatory Visit
Admission: RE | Admit: 2015-07-19 | Discharge: 2015-07-19 | Disposition: A | Discharge status: Home / Self Care | Payer: Self-pay | Source: Ambulatory Visit | Attending: Student | Admitting: Student

## 2015-07-19 DIAGNOSIS — M2242 Chondromalacia patellae, left knee: Secondary | ICD-10-CM | POA: Insufficient documentation

## 2015-07-19 DIAGNOSIS — M23352 Other meniscus derangements, posterior horn of lateral meniscus, left knee: Secondary | ICD-10-CM | POA: Insufficient documentation

## 2015-07-19 DIAGNOSIS — W1830XA Fall on same level, unspecified, initial encounter: Secondary | ICD-10-CM | POA: Insufficient documentation

## 2015-07-19 DIAGNOSIS — M25562 Pain in left knee: Secondary | ICD-10-CM | POA: Insufficient documentation

## 2015-07-19 DIAGNOSIS — M94262 Chondromalacia, left knee: Secondary | ICD-10-CM | POA: Insufficient documentation

## 2015-08-28 ENCOUNTER — Encounter: Payer: Self-pay | Admitting: *Deleted

## 2015-08-28 ENCOUNTER — Emergency Department
Admission: EM | Admit: 2015-08-28 | Discharge: 2015-08-28 | Disposition: A | Discharge status: Home / Self Care | Payer: Self-pay | Attending: Emergency Medicine | Admitting: Emergency Medicine

## 2015-08-28 DIAGNOSIS — M545 Low back pain, unspecified: Secondary | ICD-10-CM

## 2015-08-28 DIAGNOSIS — Z791 Long term (current) use of non-steroidal anti-inflammatories (NSAID): Secondary | ICD-10-CM | POA: Insufficient documentation

## 2015-08-28 MED ORDER — OXYCODONE-ACETAMINOPHEN 5-325 MG PO TABS
ORAL_TABLET | ORAL | Status: AC
  Administered 2015-08-28: 1 via ORAL
  Filled 2015-08-28: qty 1

## 2015-08-28 MED ORDER — KETOROLAC TROMETHAMINE 30 MG/ML IJ SOLN
INTRAMUSCULAR | Status: AC
Start: 2015-08-28 — End: 2015-08-28
  Administered 2015-08-28: 30 mg via INTRAMUSCULAR
  Filled 2015-08-28: qty 1

## 2015-08-28 MED ORDER — HYDROCODONE-ACETAMINOPHEN 5-325 MG PO TABS: 1.0000 | ORAL_TABLET | ORAL | Status: DC | PRN

## 2015-08-28 MED ORDER — OXYCODONE-ACETAMINOPHEN 5-325 MG PO TABS
1.0000 | ORAL_TABLET | Freq: Once | ORAL | Status: AC
  Administered 2015-08-28: 1 via ORAL

## 2015-08-28 MED ORDER — KETOROLAC TROMETHAMINE 30 MG/ML IJ SOLN
30.0000 mg | Freq: Once | INTRAMUSCULAR | Status: AC
  Administered 2015-08-28: 30 mg via INTRAMUSCULAR

## 2015-08-28 NOTE — Discharge Instructions (Signed)

## 2015-08-28 NOTE — ED Provider Notes (Signed)
[facility] Emergency Department Provider Note  ____________________________________________  Time seen: On arrival  I have reviewed the triage vital signs and the nursing notes.   HISTORY  Chief Complaint Back Pain    HPI Randy Jordan is a 55 y.o. male who presents with complaints of lower back pain. Patient reports every 6 months or so he presented his back due to a bulging disc. He reports yesterday he [place] tables at [place] and "tweaked" it and then today it became much worse. It is worse with flexion or rotating at the waist.He denies fevers. No IV drug abuse. No numbness or tingling. No saddle anesthesia. No bowel or bladder incontinence    Past Medical History  Diagnosis Date  . Bulging lumbar disc   . Lumbar vertebral fracture (HCC)     There are no active problems to display for this patient.   Past Surgical History  Procedure Laterality Date  . Fracture surgery      Right arm  . Arm wound repair / closure Right     arm fracture repair    Current Outpatient Rx  Name  Route  Sig  Dispense  Refill  . naproxen (NAPROSYN) 500 MG tablet   Oral   Take 1 tablet (500 mg total) by mouth 2 (two) times daily with a meal.   60 tablet   0   . oxyCODONE-acetaminophen (ROXICET) 5-325 MG tablet   Oral   Take 1 tablet by mouth every 6 (six) hours as needed.   9 tablet   0     Allergies Benadryl and Tramadol  No family history on file.  Social History Social History  Substance Use Topics  . Smoking status: Never Smoker   . Smokeless tobacco: None  . Alcohol Use: Yes     Comment: occaisonly    Review of Systems  Constitutional: Negative for fever.  ENT: Negative for pain   Genitourinary: Negative for incontinence Musculoskeletal: As above Skin: Negative for rash. Neurological: Negative for headaches or focal weakness   ____________________________________________   PHYSICAL EXAM:  VITAL SIGNS: ED Triage  Vitals  Enc Vitals Group     BP 08/28/15 1108 137/89 mmHg     Pulse Rate 08/28/15 1108 81     Resp 08/28/15 1108 18     Temp 08/28/15 1108 97.5 F (36.4 C)     Temp Source 08/28/15 1108 Oral     SpO2 08/28/15 1108 98 %     Weight 08/28/15 1108 245 lb (111.131 kg)     Height 08/28/15 1108  (1.778 m)     Head Cir --      Peak Flow --      Pain Score 08/28/15 1108 9     Pain Loc --      Pain Edu? --      Excl. in GC? --      Constitutional: Alert and oriented. Well appearing and in no distress. Eyes: Conjunctivae are normal.  ENT   Head: Normocephalic and atraumatic.   Mouth/Throat: Mucous membranes are moist. Cardiovascular: Normal rate, regular rhythm.  Respiratory: Normal respiratory effort without tachypnea nor retractions.  Gastrointestinal: Soft and non-tender in all quadrants. No distention. There is no CVA tenderness. No pulsatile mass Musculoskeletal: Nontender with normal range of motion in all extremities. No vertebral tenderness to palpation. No muscle spasms paraspinally Neurologic:  Normal speech and language. No gross focal neurologic deficits are appreciated. Normal strength in the lower 70s Skin:  Skin is warm, dry and intact. No rash noted. Psychiatric: Mood and affect are normal. Patient exhibits appropriate insight and judgment.  ____________________________________________    LABS (pertinent positives/negatives)  Labs Reviewed - No data to display  ____________________________________________     ____________________________________________    RADIOLOGY I have personally reviewed any xrays that were ordered on this patient: None ____________________________________________   PROCEDURES  Procedure(s) performed: none   ____________________________________________   INITIAL IMPRESSION / ASSESSMENT AND PLAN / ED COURSE  Pertinent labs & imaging results that were available during my care of the patient were reviewed by me and  considered in my medical decision making (see chart for details).  Patient with lumbar back pain. We will give Toradol IM and Percocet by mouth. He reports this worked well for him the last time this happened.  Patient had improvement in pain after the Toradol. I have asked him to follow up with his PCP as needed ____________________________________________   FINAL CLINICAL IMPRESSION(S) / ED DIAGNOSES  Final diagnoses:  Bilateral low back pain without sciatica     [name], MD 08/28/15 1459

## 2015-08-28 NOTE — ED Notes (Signed)
Patient states he has a history of bulging discs. Patient states he [place] tables at [place] yesterday which aggravated back pain.

## 2015-11-28 ENCOUNTER — Emergency Department: Payer: Self-pay

## 2015-11-28 ENCOUNTER — Emergency Department
Admission: EM | Admit: 2015-11-28 | Discharge: 2015-11-29 | Disposition: A | Discharge status: Home / Self Care | Payer: Self-pay | Attending: Emergency Medicine | Admitting: Emergency Medicine

## 2015-11-28 DIAGNOSIS — I951 Orthostatic hypotension: Secondary | ICD-10-CM | POA: Insufficient documentation

## 2015-11-28 DIAGNOSIS — R55 Syncope and collapse: Secondary | ICD-10-CM

## 2015-11-28 LAB — BASIC METABOLIC PANEL
ANION GAP: 11 (ref 5–15)
BUN: 16 mg/dL (ref 6–20)
CHLORIDE: 106 mmol/L (ref 101–111)
CO2: 23 mmol/L (ref 22–32)
Calcium: 9.5 mg/dL (ref 8.9–10.3)
Creatinine, Ser: 1.21 mg/dL (ref 0.61–1.24)
GFR calc non Af Amer: >60 mL/min (ref >60)
GLUCOSE: 101 mg/dL — AB (ref 65–99)
POTASSIUM: 3.8 mmol/L (ref 3.5–5.1)
Sodium: 140 mmol/L (ref 135–145)

## 2015-11-28 LAB — CBC
HEMATOCRIT: 45.7 % (ref 40.0–52.0)
HEMOGLOBIN: 15.6 g/dL (ref 13.0–18.0)
MCH: 31.8 pg (ref 26.0–34.0)
MCHC: 34.2 g/dL (ref 32.0–36.0)
MCV: 93.1 fL (ref 80.0–100.0)
Platelets: 173 10*3/uL (ref 150–440)
RBC: 4.92 MIL/uL (ref 4.40–5.90)
RDW: 12.8 % (ref 11.5–14.5)
WBC: 8.9 10*3/uL (ref 3.8–10.6)

## 2015-11-28 MED ORDER — ONDANSETRON HCL 4 MG/2ML IJ SOLN
4.0000 mg | Freq: Once | INTRAMUSCULAR | Status: AC
  Administered 2015-11-29: 4 mg via INTRAVENOUS

## 2015-11-28 MED ORDER — MORPHINE SULFATE (PF) 4 MG/ML IV SOLN
4.0000 mg | Freq: Once | INTRAVENOUS | Status: AC
  Administered 2015-11-29: 4 mg via INTRAVENOUS

## 2015-11-28 NOTE — ED Notes (Signed)
Pt presents to ED from home via EMS d/t syncopal episode. EMS reports pt was standing from a seated position in a recliner when he lost consciousness and awoke on the floor after an unknown amount of time. PT reports LEFT shoulder pain, centralized chest pain, and left-sided neck and rib pain. Pt unsure of head on head injury. Pt reports x2 emesis and x1 diarrhea early today after removing after removing 5 ticks. Pt is A&O, c/o 8/10 pain, with respirations even, regular, and unlabored.

## 2015-11-29 ENCOUNTER — Emergency Department: Payer: Self-pay

## 2015-11-29 LAB — URINALYSIS COMPLETE WITH MICROSCOPIC (ARMC ONLY)
BILIRUBIN URINE: NEGATIVE
Bacteria, UA: NONE SEEN
GLUCOSE, UA: 50 mg/dL — AB
Hgb urine dipstick: NEGATIVE
KETONES UR: NEGATIVE mg/dL
Leukocytes, UA: NEGATIVE
NITRITE: NEGATIVE
Protein, ur: NEGATIVE mg/dL
RBC / HPF: NONE SEEN RBC/hpf (ref 0–5)
SPECIFIC GRAVITY, URINE: 1.015 (ref 1.005–1.030)
Squamous Epithelial / LPF: NONE SEEN
pH: 5 (ref 5.0–8.0)

## 2015-11-29 LAB — TROPONIN I: Troponin I: <0.03 ng/mL (ref <0.031)

## 2015-11-29 MED ORDER — OXYCODONE-ACETAMINOPHEN 5-325 MG PO TABS: 1.0000 | ORAL_TABLET | ORAL | Status: DC | PRN

## 2015-11-29 MED ORDER — DOXYCYCLINE HYCLATE 50 MG PO CAPS: 100.0000 mg | ORAL_CAPSULE | Freq: Two times a day (BID) | ORAL | Status: AC

## 2015-11-29 MED ORDER — OXYCODONE-ACETAMINOPHEN 5-325 MG PO TABS
1.0000 | ORAL_TABLET | Freq: Once | ORAL | Status: AC
  Administered 2015-11-29: 1 via ORAL

## 2015-11-29 MED ORDER — SODIUM CHLORIDE 0.9 % IV BOLUS (SEPSIS)
1000.0000 mL | Freq: Once | INTRAVENOUS | Status: AC
  Administered 2015-11-29: 1000 mL via INTRAVENOUS

## 2015-11-29 NOTE — ED Provider Notes (Signed)
[facility] Emergency Department Provider Note  ____________________________________________  Time seen: 12:00AM  I have reviewed the triage vital signs and the nursing notes.   HISTORY  Chief Complaint Loss of Consciousness     HPI Randy Jordan is a 55 y.o. male presents to the emergency department via EMS status post syncopal episode. Patient states episode occurred after standing from seated position in a recliner. Patient states he had 2 episodes of vomiting and diarrhea for the event. In addition the patient states that he removed 5 takes from his body yesterday. Patient denies any fever afebrile on presentation to the emergency part. Patient denies any weakness or numbness at this time patient denies any gait instability, denies any speech or vision changes     Past Medical History  Diagnosis Date  . Bulging lumbar disc   . Lumbar vertebral fracture (HCC)     There are no active problems to display for this patient.   Past Surgical History  Procedure Laterality Date  . Fracture surgery      Right arm  . Arm wound repair / closure Right     arm fracture repair    Current Outpatient Rx  Name  Route  Sig  Dispense  Refill  . HYDROcodone-acetaminophen (NORCO/VICODIN) 5-325 MG tablet   Oral   Take 1 tablet by mouth every 4 (four) hours as needed for moderate pain.   20 tablet   0   . naproxen (NAPROSYN) 500 MG tablet   Oral   Take 1 tablet (500 mg total) by mouth 2 (two) times daily with a meal.   60 tablet   0   . oxyCODONE-acetaminophen (ROXICET) 5-325 MG tablet   Oral   Take 1 tablet by mouth every 6 (six) hours as needed.   9 tablet   0     Allergies Benadryl and Tramadol  No family history on file.  Social History Social History  Substance Use Topics  . Smoking status: Never Smoker   . Smokeless tobacco: None  . Alcohol Use: Yes     Comment: occaisonly    Review of Systems  Constitutional: Negative for  fever. Eyes: Negative for visual changes. ENT: Negative for sore throat. Cardiovascular: Negative for chest pain. Respiratory: Negative for shortness of breath. Gastrointestinal: Negative for abdominal pain, vomiting and diarrhea. Genitourinary: Negative for dysuria. Musculoskeletal: Negative for back pain. Skin: Negative for rash. Neurological: Positive for syncope   10-point ROS otherwise negative.  ____________________________________________   PHYSICAL EXAM:  VITAL SIGNS: ED Triage Vitals  Enc Vitals Group     BP 11/28/15 2251 131/93 mmHg     Pulse Rate 11/28/15 2251 93     Resp 11/28/15 2251 11     Temp 11/28/15 2252 98 F (36.7 C)     Temp Source 11/28/15 2252 Oral     SpO2 11/28/15 2247 95 %     Weight 11/28/15 2252 250 lb (113.399 kg)     Height 11/28/15 2252  (1.753 m)     Head Cir --      Peak Flow --      Pain Score 11/28/15 2253 8     Pain Loc --      Pain Edu? --      Excl. in GC? --      Constitutional: Alert and oriented. Well appearing and in no distress. Eyes: Conjunctivae are normal. PERRL. Normal extraocular movements. ENT   Head: Normocephalic and atraumatic.   Nose:  No congestion/rhinnorhea.   Mouth/Throat: Mucous membranes are moist.   Neck: No stridor. Hematological/Lymphatic/Immunilogical: No cervical lymphadenopathy. Cardiovascular: Normal rate, regular rhythm. Normal and symmetric distal pulses are present in all extremities. No murmurs, rubs, or gallops. Respiratory: Normal respiratory effort without tachypnea nor retractions. Breath sounds are clear and equal bilaterally. No wheezes/rales/rhonchi. Gastrointestinal: Soft and nontender. No distention. There is no CVA tenderness. Genitourinary: deferred Musculoskeletal: Left shoulder pain with passive and active range of motion. Neurologic:  Normal speech and language. No gross focal neurologic deficits are appreciated. Speech is normal.  Skin:  Skin is warm, dry and  intact. No rash noted. Psychiatric: Mood and affect are normal. Speech and behavior are normal. Patient exhibits appropriate insight and judgment.  ____________________________________________    LABS (pertinent positives/negatives)  Labs Reviewed  BASIC METABOLIC PANEL - Abnormal; Notable for the following:    Glucose, Bld 101 (*)    All other components within normal limits  URINALYSIS COMPLETEWITH MICROSCOPIC ([facility] ONLY) - Abnormal; Notable for the following:    Color, Urine YELLOW (*)    APPearance CLEAR (*)    Glucose, UA 50 (*)    All other components within normal limits  CBC  TROPONIN I     ____________________________________________   EKG  ED ECG REPORT I, [place] Leveda Kendrix, the attending physician, personally viewed and interpreted this ECG.   Date: 11/29/2015  EKG Time: 10:51 PM  Rate: 89  Rhythm: Normal sinus rhythm  Axis: Normal  Intervals: Normal  ST&T Change: None   ____________________________________________    RADIOLOGY    Imaging Results       DG Shoulder Left (Final result) Result time: 11/29/15 00:35:47   Final result by Rad Results In Interface (11/29/15 00:35:47)   Narrative:   CLINICAL DATA: Left shoulder pain after syncope/fall.  EXAM: LEFT SHOULDER - 2+ VIEW  COMPARISON: None.  FINDINGS: No fracture or dislocation. Mild proliferative change at the acromioclavicular joint. Mild osteophytes of the inferior glenohumeral joint. No abnormal soft tissue calcifications.  IMPRESSION: Left shoulder degenerative change without acute fracture or subluxation.   Electronically Signed By: Rubye OaksMelanie Ehinger M.D. On: 11/29/2015 00:35          CT Head Wo Contrast (Final result) Result time: 11/28/15 23:42:21   Final result by Rad Results In Interface (11/28/15 23:42:21)   Narrative:   CLINICAL DATA: Syncope, unwitnessed fall, left-sided neck pain  EXAM: CT HEAD WITHOUT CONTRAST  CT CERVICAL SPINE WITHOUT  CONTRAST  TECHNIQUE: Multidetector CT imaging of the head and cervical spine was performed following the standard protocol without intravenous contrast. Multiplanar CT image reconstructions of the cervical spine were also generated.  COMPARISON: CT head dated 03/21/2014  FINDINGS: CT HEAD FINDINGS  No evidence of parenchymal hemorrhage or extra-axial fluid collection. No mass lesion, mass effect, or midline shift.  No CT evidence of acute infarction.  Cerebral volume is within normal limits. No ventriculomegaly.  The visualized paranasal sinuses are essentially clear. The mastoid air cells are unopacified.  No evidence of calvarial fracture.  CT CERVICAL SPINE FINDINGS  Mild straightening of the normal upper cervical lordosis.  No evidence of fracture or dislocation. Vertebral heights are maintained. Dens appears intact.  No prevertebral soft tissue swelling.  Mild degenerative changes at C5-6 and C6-7.  Visualized thyroid is unremarkable.  Visualized lung apices are clear.  IMPRESSION: Normal head CT.  No evidence of traumatic injury to the cervical spine. Mild degenerative changes of the lower cervical spine.   Electronically Signed By:  Charline Bills M.D. On: 11/28/2015 23:42          CT Cervical Spine Wo Contrast (Final result) Result time: 11/28/15 23:42:21   Final result by Rad Results In Interface (11/28/15 23:42:21)   Narrative:   CLINICAL DATA: Syncope, unwitnessed fall, left-sided neck pain  EXAM: CT HEAD WITHOUT CONTRAST  CT CERVICAL SPINE WITHOUT CONTRAST  TECHNIQUE: Multidetector CT imaging of the head and cervical spine was performed following the standard protocol without intravenous contrast. Multiplanar CT image reconstructions of the cervical spine were also generated.  COMPARISON: CT head dated 03/21/2014  FINDINGS: CT HEAD FINDINGS  No evidence of parenchymal hemorrhage or extra-axial fluid collection. No  mass lesion, mass effect, or midline shift.  No CT evidence of acute infarction.  Cerebral volume is within normal limits. No ventriculomegaly.  The visualized paranasal sinuses are essentially clear. The mastoid air cells are unopacified.  No evidence of calvarial fracture.  CT CERVICAL SPINE FINDINGS  Mild straightening of the normal upper cervical lordosis.  No evidence of fracture or dislocation. Vertebral heights are maintained. Dens appears intact.  No prevertebral soft tissue swelling.  Mild degenerative changes at C5-6 and C6-7.  Visualized thyroid is unremarkable.  Visualized lung apices are clear.  IMPRESSION: Normal head CT.  No evidence of traumatic injury to the cervical spine. Mild degenerative changes of the lower cervical spine.   Electronically Signed By: Charline Bills M.D. On: 11/28/2015 23:42          DG Chest 2 View (Final result) Result time: 11/28/15 23:30:30   Final result by Rad Results In Interface (11/28/15 23:30:30)   Narrative:   CLINICAL DATA: Acute onset of syncope. Central chest pain, and left-sided neck and rib pain. Vomiting and diarrhea. Initial encounter.  EXAM: CHEST 2 VIEW  COMPARISON: Chest radiograph performed 12/02/2014  FINDINGS: The lungs are well-aerated and clear. There is no evidence of focal opacification, pleural effusion or pneumothorax.  The heart is normal in size; the mediastinal contour is within normal limits. No acute osseous abnormalities are seen. Clips are noted within the right upper quadrant, reflecting prior cholecystectomy.  IMPRESSION: No acute cardiopulmonary process seen.   Electronically Signed By: Roanna Raider M.D. On: 11/28/2015 23:30     INITIAL IMPRESSION / ASSESSMENT AND PLAN / ED COURSE  Pertinent labs & imaging results that were available during my care of the patient were reviewed by me and considered in my medical decision making (see chart for  details).  Patient feel much better status post 1 L IV normal saline. Patient's syncopal episode most likely secondary to orthostatic hypertension is evident by vital signs emergency department.  ____________________________________________   FINAL CLINICAL IMPRESSION(S) / ED DIAGNOSES  Final diagnoses:  Orthostatic hypotension  Syncope, unspecified syncope type      Darci Current, MD 11/30/15 [phone]

## 2015-11-29 NOTE — Discharge Instructions (Signed)
Orthostatic Hypotension °Orthostatic hypotension is a sudden drop in blood pressure. It happens when you quickly stand up from a seated or lying position. You may feel dizzy or light-headed. This can last for just a few seconds or for up to a few minutes. It is usually not a serious problem. However, if this happens frequently or gets worse, it can be a sign of something more serious. °CAUSES  °Different things can cause orthostatic hypotension, including:  °· Loss of body fluids (dehydration). °· Medicines that lower blood pressure. °· Sudden changes in posture, such as standing up quickly after you have been sitting or lying down. °· Taking too much of your medicine. °SIGNS AND SYMPTOMS  °· Light-headedness or dizziness.   °· Fainting or near-fainting.   °· A fast heart rate.   °· Weakness.   °· Feeling tired (fatigue).   °DIAGNOSIS  °Your health care provider may do several things to help diagnose your condition and identify the cause. These may include:  °· Taking a medical history and doing a physical exam. °· Checking your blood pressure. Your health care provider will check your blood pressure when you are: °· Lying down. °· Sitting. °· Standing. °· Using tilt table testing. In this test, you lie down on a table that moves from a lying position to a standing position. You will be strapped onto the table. This test monitors your blood pressure and heart rate when you are in different positions. °TREATMENT  °Treatment will vary depending on the cause. Possible treatments include:  °· Changing the dosage of your medicines.  °· Wearing compression stockings on your lower legs. °· Standing up slowly after sitting or lying down. °· Eating more salt. °· Eating frequent, small meals. °· In some cases, getting IV fluids. °· Taking medicine to enhance fluid retention. °HOME CARE INSTRUCTIONS °· Only take over-the-counter or prescription medicines as directed by your health care provider. °· Follow your health care  provider's instructions for changing the dosage of your current medicines. °· Do not stop or adjust your medicine on your own. °· Stand up slowly after sitting or lying down. This allows your body to adjust to the different position. °· Wear compression stockings as directed. °· Eat extra salt as directed. °· Do not add extra salt to your diet unless directed to by your health care provider. °· Eat frequent, small meals. °· Avoid standing suddenly after eating. °· Avoid hot showers or excessive heat as directed by your health care provider. °· Keep all follow-up appointments. °SEEK MEDICAL CARE IF: °· You continue to feel dizzy or light-headed after standing. °· You feel groggy or confused. °· You feel cold, clammy, or sick to your stomach (nauseous). °· You have blurred vision. °· You feel short of breath. °SEEK IMMEDIATE MEDICAL CARE IF:  °· You faint after standing. °· You have chest pain.  °· You have difficulty breathing.   °· You lose feeling or movement in your arms or legs.   °· You have slurred speech or difficulty talking, or you are unable to talk.   °MAKE SURE YOU:  °· Understand these instructions. °· Will watch your condition. °· Will get help right away if you are not doing well or get worse. °  °This information is not intended to replace advice given to you by your health care provider. Make sure you discuss any questions you have with your health care provider. °  °Document Released: 06/07/2002 Document Revised: 06/22/2013 Document Reviewed: 04/09/2013 °Elsevier Interactive Patient Education ©2016 [employer] ° °Syncope °Syncope   is a medical term for fainting or passing out. This means you lose consciousness and drop to the ground. People are generally unconscious for less than 5 minutes. You may have some muscle twitches for up to 15 seconds before waking up and returning to normal. Syncope occurs more often in older adults, but it can happen to anyone. While most causes of syncope are not  dangerous, syncope can be a sign of a serious medical problem. It is important to seek medical care.  °CAUSES  °Syncope is caused by a sudden drop in blood flow to the brain. The specific cause is often not determined. Factors that can bring on syncope include: °· Taking medicines that lower blood pressure. °· Sudden changes in posture, such as standing up quickly. °· Taking more medicine than prescribed. °· Standing in one place for too long. °· Seizure disorders. °· Dehydration and excessive exposure to heat. °· Low blood sugar (hypoglycemia). °· Straining to have a bowel movement. °· Heart disease, irregular heartbeat, or other circulatory problems. °· Fear, emotional distress, seeing blood, or severe pain. °SYMPTOMS  °Right before fainting, you may: °· Feel dizzy or light-headed. °· Feel nauseous. °· See all white or all black in your field of vision. °· Have cold, clammy skin. °DIAGNOSIS  °Your health care provider will ask about your symptoms, perform a physical exam, and perform an electrocardiogram (ECG) to record the electrical activity of your heart. Your health care provider may also perform other heart or blood tests to determine the cause of your syncope which may include: °· Transthoracic echocardiogram (TTE). During echocardiography, sound waves are used to evaluate how blood flows through your heart. °· Transesophageal echocardiogram (TEE). °· Cardiac monitoring. This allows your health care provider to monitor your heart rate and rhythm in real time. °· Holter monitor. This is a portable device that records your heartbeat and can help diagnose heart arrhythmias. It allows your health care provider to track your heart activity for several days, if needed. °· Stress tests by exercise or by giving medicine that makes the heart beat faster. °TREATMENT  °In most cases, no treatment is needed. Depending on the cause of your syncope, your health care provider may recommend changing or stopping some of your  medicines. °HOME CARE INSTRUCTIONS °· Have someone stay with you until you feel stable. °· Do not drive, use machinery, or play sports until your health care provider says it is okay. °· Keep all follow-up appointments as directed by your health care provider. °· Lie down right away if you start feeling like you might faint. Breathe deeply and steadily. Wait until all the symptoms have passed. °· Drink enough fluids to keep your urine clear or pale yellow. °· If you are taking blood pressure or heart medicine, get up slowly and take several minutes to sit and then stand. This can reduce dizziness. °SEEK IMMEDIATE MEDICAL CARE IF:  °· You have a severe headache. °· You have unusual pain in the chest, abdomen, or back. °· You are bleeding from your mouth or rectum, or you have black or tarry stool. °· You have an irregular or very fast heartbeat. °· You have pain with breathing. °· You have repeated fainting or seizure-like jerking during an episode. °· You faint when sitting or lying down. °· You have confusion. °· You have trouble walking. °· You have severe weakness. °· You have vision problems. °If you fainted, call your local emergency services (911 in [place] Do not drive yourself   to the hospital.  °  °This information is not intended to replace advice given to you by your health care provider. Make sure you discuss any questions you have with your health care provider. °  °Document Released: 06/17/2005 Document Revised: 11/01/2014 Document Reviewed: 08/16/2011 °Elsevier Interactive Patient Education ©2016 [employer] Inc[employer] ° °

## 2015-11-29 NOTE — ED Notes (Signed)
Patient transported to X-ray 

## 2016-03-01 DIAGNOSIS — R55 Syncope and collapse: Secondary | ICD-10-CM

## 2016-03-01 HISTORY — DX: Syncope and collapse: R55

## 2016-03-11 ENCOUNTER — Encounter: Payer: Self-pay | Admitting: Emergency Medicine

## 2016-03-11 ENCOUNTER — Emergency Department
Admission: EM | Admit: 2016-03-11 | Discharge: 2016-03-11 | Disposition: A | Discharge status: Home / Self Care | Payer: Self-pay | Attending: Student in an Organized Health Care Education/Training Program | Admitting: Student in an Organized Health Care Education/Training Program

## 2016-03-11 DIAGNOSIS — G8929 Other chronic pain: Secondary | ICD-10-CM | POA: Insufficient documentation

## 2016-03-11 DIAGNOSIS — M5489 Other dorsalgia: Secondary | ICD-10-CM

## 2016-03-11 DIAGNOSIS — M549 Dorsalgia, unspecified: Secondary | ICD-10-CM | POA: Insufficient documentation

## 2016-03-11 MED ORDER — KETOROLAC TROMETHAMINE 60 MG/2ML IM SOLN
60.0000 mg | Freq: Once | INTRAMUSCULAR | Status: AC
  Administered 2016-03-11: 60 mg via INTRAMUSCULAR
  Filled 2016-03-11: qty 2

## 2016-03-11 MED ORDER — OXYCODONE-ACETAMINOPHEN 5-325 MG PO TABS
1.0000 | ORAL_TABLET | Freq: Once | ORAL | Status: AC
  Administered 2016-03-11: 1 via ORAL
  Filled 2016-03-11: qty 1

## 2016-03-11 MED ORDER — OXYCODONE-ACETAMINOPHEN 7.5-325 MG PO TABS: 1.0000 | ORAL_TABLET | ORAL | 0 refills | Status: DC | PRN

## 2016-03-11 MED ORDER — NAPROXEN 500 MG PO TABS: 500.0000 mg | ORAL_TABLET | Freq: Two times a day (BID) | ORAL | Status: DC

## 2016-03-11 NOTE — ED Notes (Signed)
See triage note   States he has a bulging disc and has had "flare" ups about 2-3 times a time ambulates well to treatment area

## 2016-03-11 NOTE — ED Provider Notes (Signed)
[facility] Emergency Department Provider Note   ____________________________________________   None    (approximate)  I have reviewed the triage vital signs and the nursing notes.   HISTORY  Chief Complaint Back Pain    HPI Randy Jordan is a 55 y.o. male patient complaining of flareup of his chronic back pain. Patient has degenerative disc disease has intermitting episodes of acute back pain. Patient denies any radicular component to this back pain. Patient denies any bladder or bowel dysfunction. No palliative measures taken for this complaint.Patient's redness pain is 8/10. Patient described a pain as "sharp".   Past Medical History:  Diagnosis Date  . Bulging lumbar disc   . Lumbar vertebral fracture (HCC)     There are no active problems to display for this patient.   Past Surgical History:  Procedure Laterality Date  . ARM WOUND REPAIR / CLOSURE Right    arm fracture repair  . FRACTURE SURGERY     Right arm    Prior to Admission medications   Medication Sig Start Date End Date Taking? Authorizing Provider  HYDROcodone-acetaminophen (NORCO/VICODIN) 5-325 MG tablet Take 1 tablet by mouth every 4 (four) hours as needed for moderate pain. 08/28/15   [name], MD  naproxen (NAPROSYN) 500 MG tablet Take 1 tablet (500 mg total) by mouth 2 (two) times daily with a meal. 06/08/15 06/07/16  Chinita Pester, FNP  naproxen (NAPROSYN) 500 MG tablet Take 1 tablet (500 mg total) by mouth 2 (two) times daily with a meal. 03/11/16   Joni Reining, PA-C  oxyCODONE-acetaminophen (PERCOCET) 7.5-325 MG tablet Take 1 tablet by mouth every 4 (four) hours as needed for severe pain. 03/11/16   Joni Reining, PA-C  oxyCODONE-acetaminophen (PERCOCET/ROXICET) 5-325 MG tablet Take 1 tablet by mouth every 4 (four) hours as needed for severe pain. 11/29/15   Darci Current, MD  oxyCODONE-acetaminophen (ROXICET) 5-325 MG tablet Take 1 tablet by mouth every 6  (six) hours as needed. 06/08/15   Chinita Pester, FNP    Allergies   No family history on file.  Social History Social History  Substance Use Topics  . Smoking status: Never Smoker  . Smokeless tobacco: Never Used  . Alcohol use Yes     Comment: occaisonly    Review of Systems Constitutional: No fever/chills Eyes: No visual changes. ENT: No sore throat. Cardiovascular: Denies chest pain. Respiratory: Denies shortness of breath. Gastrointestinal: No abdominal pain.  No nausea, no vomiting.  No diarrhea.  No constipation. Genitourinary: Negative for dysuria. Musculoskeletal: Positive for back pain. Skin: Negative for rash. Neurological: Negative for headaches, focal weakness or numbness.    ____________________________________________   PHYSICAL EXAM:  VITAL SIGNS: ED Triage Vitals  Enc Vitals Group     BP 03/11/16 0852 (!) 146/95     Pulse Rate 03/11/16 0852 86     Resp 03/11/16 0852 18     Temp 03/11/16 0852 97.9 F (36.6 C)     Temp Source 03/11/16 0852 Oral     SpO2 03/11/16 0852 97 %     Weight 03/11/16 0852 240 lb (108.9 kg)     Height 03/11/16 0852 5\' 10"  (1.778 m)     Head Circumference --      Peak Flow --      Pain Score 03/11/16 0853 8     Pain Loc --      Pain Edu? --      Excl. in GC? --  Constitutional: Alert and oriented. Well appearing and in no acute distress. Eyes: Conjunctivae are normal. PERRL. EOMI. Head: Atraumatic. Nose: No congestion/rhinnorhea. Mouth/Throat: Mucous membranes are moist.  Oropharynx non-erythematous. Neck: No stridor.  No cervical spine tenderness to palpation. Hematological/Lymphatic/Immunilogical: No cervical lymphadenopathy. Cardiovascular: Normal rate, regular rhythm. Grossly normal heart sounds.  Good peripheral circulation. Respiratory: Normal respiratory effort.  No retractions. Lungs CTAB. Gastrointestinal: Soft and nontender. No distention. No abdominal bruits. No CVA tenderness. Musculoskeletal: No  lower extremity tenderness nor edema.  No joint effusions. No paraspinal muscle spasm. Patient decreased range of motion with flexion. Patient has negative straight leg test. Neurologic:  Normal speech and language. No gross focal neurologic deficits are appreciated. No gait instability. Skin:  Skin is warm, dry and intact. No rash noted. Psychiatric: Mood and affect are normal. Speech and behavior are normal.  ____________________________________________   LABS (all labs ordered are listed, but only abnormal results are displayed)  Labs Reviewed - No data to display ____________________________________________  EKG   ____________________________________________  RADIOLOGY   ____________________________________________   PROCEDURES  Procedure(s) performed: None  Procedures  Critical Care performed: No  ____________________________________________   INITIAL IMPRESSION / ASSESSMENT AND PLAN / ED COURSE  Pertinent labs & imaging results that were available during my care of the patient were reviewed by me and considered in my medical decision making (see chart for details).  Acute low back pain. Patient given discharge care instructions. Patient advised follow-up family doctor for continued care.  Clinical Course   Patient given Toradol IM and Percocet in the ED.  ____________________________________________   FINAL CLINICAL IMPRESSION(S) / ED DIAGNOSES  Final diagnoses:  Midline back pain, unspecified location      NEW MEDICATIONS STARTED DURING THIS VISIT:  New Prescriptions   NAPROXEN (NAPROSYN) 500 MG TABLET    Take 1 tablet (500 mg total) by mouth 2 (two) times daily with a meal.   OXYCODONE-ACETAMINOPHEN (PERCOCET) 7.5-325 MG TABLET    Take 1 tablet by mouth every 4 (four) hours as needed for severe pain.     Note:  This document was prepared using Dragon voice recognition software and may include unintentional dictation errors.    Joni Reiningonald K  Mayo Faulk, PA-C 03/11/16 [id number]    Willy EddyPatrick Robinson, MD 03/11/16 1014

## 2016-03-11 NOTE — ED Triage Notes (Signed)
Patient presents to the ED with chronic back pain.  Patient states, "It flares up every once in a while and I have to come in here, probably about 3 times a year."  Patient states this flare up began yesterday.  Reports pain as sharp.  Patient states, "I have bulging disc disease."

## 2016-03-21 ENCOUNTER — Observation Stay
Admission: EM | Admit: 2016-03-21 | Discharge: 2016-03-22 | Disposition: A | Discharge status: Home / Self Care | Payer: Self-pay | Attending: Internal Medicine | Admitting: Internal Medicine

## 2016-03-21 ENCOUNTER — Emergency Department: Payer: Self-pay

## 2016-03-21 ENCOUNTER — Encounter: Payer: Self-pay | Admitting: Emergency Medicine

## 2016-03-21 DIAGNOSIS — W19XXXA Unspecified fall, initial encounter: Secondary | ICD-10-CM | POA: Insufficient documentation

## 2016-03-21 DIAGNOSIS — Z888 Allergy status to other drugs, medicaments and biological substances status: Secondary | ICD-10-CM | POA: Insufficient documentation

## 2016-03-21 DIAGNOSIS — F1410 Cocaine abuse, uncomplicated: Secondary | ICD-10-CM | POA: Insufficient documentation

## 2016-03-21 DIAGNOSIS — I4581 Long QT syndrome: Secondary | ICD-10-CM | POA: Insufficient documentation

## 2016-03-21 DIAGNOSIS — M5126 Other intervertebral disc displacement, lumbar region: Secondary | ICD-10-CM | POA: Insufficient documentation

## 2016-03-21 DIAGNOSIS — G8929 Other chronic pain: Secondary | ICD-10-CM | POA: Insufficient documentation

## 2016-03-21 DIAGNOSIS — M549 Dorsalgia, unspecified: Secondary | ICD-10-CM | POA: Insufficient documentation

## 2016-03-21 DIAGNOSIS — I491 Atrial premature depolarization: Secondary | ICD-10-CM | POA: Insufficient documentation

## 2016-03-21 DIAGNOSIS — R55 Syncope and collapse: Principal | ICD-10-CM

## 2016-03-21 DIAGNOSIS — Z9049 Acquired absence of other specified parts of digestive tract: Secondary | ICD-10-CM | POA: Insufficient documentation

## 2016-03-21 DIAGNOSIS — M5134 Other intervertebral disc degeneration, thoracic region: Secondary | ICD-10-CM | POA: Insufficient documentation

## 2016-03-21 DIAGNOSIS — Z79899 Other long term (current) drug therapy: Secondary | ICD-10-CM | POA: Insufficient documentation

## 2016-03-21 DIAGNOSIS — I071 Rheumatic tricuspid insufficiency: Secondary | ICD-10-CM | POA: Insufficient documentation

## 2016-03-21 DIAGNOSIS — R072 Precordial pain: Secondary | ICD-10-CM

## 2016-03-21 DIAGNOSIS — Z8249 Family history of ischemic heart disease and other diseases of the circulatory system: Secondary | ICD-10-CM | POA: Insufficient documentation

## 2016-03-21 HISTORY — DX: Other chronic pain: G89.29

## 2016-03-21 HISTORY — DX: Cocaine abuse, uncomplicated: F14.10

## 2016-03-21 HISTORY — DX: Dorsalgia, unspecified: M54.9

## 2016-03-21 LAB — CBC WITH DIFFERENTIAL/PLATELET
BASOS ABS: 0 10*3/uL (ref 0–0.1)
BASOS PCT: 0 %
EOS ABS: 0.1 10*3/uL (ref 0–0.7)
Eosinophils Relative: 1 %
HEMATOCRIT: 44 % (ref 40.0–52.0)
HEMOGLOBIN: 15.3 g/dL (ref 13.0–18.0)
Lymphocytes Relative: 24 %
Lymphs Abs: 2.2 10*3/uL (ref 1.0–3.6)
MCH: 32.2 pg (ref 26.0–34.0)
MCHC: 34.7 g/dL (ref 32.0–36.0)
MCV: 92.8 fL (ref 80.0–100.0)
MONOS PCT: 9 %
Monocytes Absolute: 0.8 10*3/uL (ref 0.2–1.0)
NEUTROS ABS: 6.1 10*3/uL (ref 1.4–6.5)
NEUTROS PCT: 66 %
Platelets: 146 10*3/uL — ABNORMAL LOW (ref 150–440)
RBC: 4.74 MIL/uL (ref 4.40–5.90)
RDW: 12.6 % (ref 11.5–14.5)
WBC: 9.2 10*3/uL (ref 3.8–10.6)

## 2016-03-21 LAB — CBC
HEMATOCRIT: 43.8 % (ref 40.0–52.0)
HEMOGLOBIN: 14.7 g/dL (ref 13.0–18.0)
MCH: 31.6 pg (ref 26.0–34.0)
MCHC: 33.6 g/dL (ref 32.0–36.0)
MCV: 94.1 fL (ref 80.0–100.0)
Platelets: 141 10*3/uL — ABNORMAL LOW (ref 150–440)
RBC: 4.65 MIL/uL (ref 4.40–5.90)
RDW: 12.5 % (ref 11.5–14.5)
WBC: 8.8 10*3/uL (ref 3.8–10.6)

## 2016-03-21 LAB — COMPREHENSIVE METABOLIC PANEL
ALBUMIN: 4.5 g/dL (ref 3.5–5.0)
ALT: 60 U/L (ref 17–63)
ANION GAP: 9 (ref 5–15)
AST: 43 U/L — ABNORMAL HIGH (ref 15–41)
Alkaline Phosphatase: 70 U/L (ref 38–126)
BILIRUBIN TOTAL: 0.7 mg/dL (ref 0.3–1.2)
BUN: 16 mg/dL (ref 6–20)
CO2: 22 mmol/L (ref 22–32)
Calcium: 9.3 mg/dL (ref 8.9–10.3)
Chloride: 107 mmol/L (ref 101–111)
Creatinine, Ser: 1.17 mg/dL (ref 0.61–1.24)
GFR calc non Af Amer: >60 mL/min (ref >60)
GLUCOSE: 104 mg/dL — AB (ref 65–99)
POTASSIUM: 3.5 mmol/L (ref 3.5–5.1)
Sodium: 138 mmol/L (ref 135–145)
TOTAL PROTEIN: 7.8 g/dL (ref 6.5–8.1)

## 2016-03-21 LAB — LIPASE, BLOOD: LIPASE: 24 U/L (ref 11–51)

## 2016-03-21 LAB — TROPONIN I: TROPONIN I: 0.03 ng/mL — AB (ref <0.03)

## 2016-03-21 LAB — CREATININE, SERUM: Creatinine, Ser: 1.22 mg/dL (ref 0.61–1.24)

## 2016-03-21 MED ORDER — KETOROLAC TROMETHAMINE 30 MG/ML IJ SOLN
15.0000 mg | INTRAMUSCULAR | Status: AC
  Administered 2016-03-21: 15 mg via INTRAVENOUS
  Filled 2016-03-21: qty 1

## 2016-03-21 MED ORDER — MORPHINE SULFATE (PF) 4 MG/ML IV SOLN
4.0000 mg | Freq: Once | INTRAVENOUS | Status: AC
  Administered 2016-03-21: 4 mg via INTRAVENOUS

## 2016-03-21 MED ORDER — SODIUM CHLORIDE 0.9 % IV BOLUS (SEPSIS)
1000.0000 mL | Freq: Once | INTRAVENOUS | Status: AC
  Administered 2016-03-21: 1000 mL via INTRAVENOUS

## 2016-03-21 MED ORDER — ASPIRIN EC 81 MG PO TBEC
81.0000 mg | DELAYED_RELEASE_TABLET | Freq: Every day | ORAL | Status: DC
  Administered 2016-03-21 – 2016-03-22 (×2): 81 mg via ORAL
  Filled 2016-03-21 (×2): qty 1

## 2016-03-21 MED ORDER — METOPROLOL TARTRATE 25 MG PO TABS
25.0000 mg | ORAL_TABLET | Freq: Two times a day (BID) | ORAL | Status: DC
  Administered 2016-03-21 – 2016-03-22 (×2): 25 mg via ORAL
  Filled 2016-03-21 (×2): qty 1

## 2016-03-21 MED ORDER — ONDANSETRON HCL 4 MG PO TABS: 4.0000 mg | ORAL_TABLET | Freq: Four times a day (QID) | ORAL | Status: DC | PRN

## 2016-03-21 MED ORDER — OXYCODONE-ACETAMINOPHEN 7.5-325 MG PO TABS
1.0000 | ORAL_TABLET | ORAL | Status: DC | PRN
  Administered 2016-03-21 – 2016-03-22 (×4): 1 via ORAL
  Filled 2016-03-21 (×4): qty 1

## 2016-03-21 MED ORDER — ONDANSETRON HCL 4 MG/2ML IJ SOLN: 4.0000 mg | Freq: Four times a day (QID) | INTRAMUSCULAR | Status: DC | PRN

## 2016-03-21 MED ORDER — BISACODYL 5 MG PO TBEC: 5.0000 mg | DELAYED_RELEASE_TABLET | Freq: Every day | ORAL | Status: DC | PRN

## 2016-03-21 MED ORDER — ACETAMINOPHEN 325 MG PO TABS: 650.0000 mg | ORAL_TABLET | Freq: Four times a day (QID) | ORAL | Status: DC | PRN

## 2016-03-21 MED ORDER — NAPROXEN 500 MG PO TABS
500.0000 mg | ORAL_TABLET | Freq: Two times a day (BID) | ORAL | Status: DC
  Administered 2016-03-22: 500 mg via ORAL
  Filled 2016-03-21 (×2): qty 1

## 2016-03-21 MED ORDER — DOCUSATE SODIUM 100 MG PO CAPS
100.0000 mg | ORAL_CAPSULE | Freq: Two times a day (BID) | ORAL | Status: DC
  Administered 2016-03-21 – 2016-03-22 (×2): 100 mg via ORAL
  Filled 2016-03-21 (×2): qty 1

## 2016-03-21 MED ORDER — ONDANSETRON HCL 4 MG/2ML IJ SOLN
4.0000 mg | Freq: Once | INTRAMUSCULAR | Status: AC
  Administered 2016-03-21: 4 mg via INTRAVENOUS

## 2016-03-21 MED ORDER — ACETAMINOPHEN 650 MG RE SUPP: 650.0000 mg | Freq: Four times a day (QID) | RECTAL | Status: DC | PRN

## 2016-03-21 MED ORDER — NITROGLYCERIN 2 % TD OINT
0.5000 [in_us] | TOPICAL_OINTMENT | Freq: Four times a day (QID) | TRANSDERMAL | Status: DC
  Administered 2016-03-21 – 2016-03-22 (×3): 0.5 [in_us] via TOPICAL
  Filled 2016-03-21 (×3): qty 1

## 2016-03-21 MED ORDER — MORPHINE SULFATE (PF) 4 MG/ML IV SOLN
INTRAVENOUS | Status: AC
  Administered 2016-03-21: 4 mg via INTRAVENOUS
  Filled 2016-03-21: qty 1

## 2016-03-21 MED ORDER — ASPIRIN 81 MG PO CHEW
324.0000 mg | CHEWABLE_TABLET | Freq: Once | ORAL | Status: AC
  Administered 2016-03-21: 324 mg via ORAL
  Filled 2016-03-21: qty 4

## 2016-03-21 MED ORDER — ONDANSETRON HCL 4 MG/2ML IJ SOLN
INTRAMUSCULAR | Status: AC
  Administered 2016-03-21: 4 mg via INTRAVENOUS
  Filled 2016-03-21: qty 2

## 2016-03-21 MED ORDER — ENOXAPARIN SODIUM 40 MG/0.4ML ~~LOC~~ SOLN
40.0000 mg | SUBCUTANEOUS | Status: DC
  Administered 2016-03-21: 40 mg via SUBCUTANEOUS
  Filled 2016-03-21: qty 0.4

## 2016-03-21 NOTE — ED Provider Notes (Signed)
[facility] Emergency Department Provider Note  ____________________________________________  Time seen: Approximately 3:58 PM  I have reviewed the triage vital signs and the nursing notes.   HISTORY  Chief Complaint Chest Pain    HPI Randy Jordan is a 55 y.o. male who was mowing his lawn today when he got very dizzy and felt like he was having palpitations. He went inside and sat down and drank some cold water, and then passed out. When he woke up he was having chest pain. He reports that he has poor recall of this but he thinks that the pain was in the center of his chest, sharp and achy, nonradiating, associated with shortness of breath diaphoresis and vomiting. It lasted 10 or 15 minutes and then resolved by the time EMS arrived. They've not given any medications. Patient reports this is never happened before. Denies any medical history except for one prior syncope episode a year ago. No prior cardiac workups.  He also complains of right shoulder pain and thinks that he fell onto his right shoulder when he passed out.  No aggravating or alleviating factors to the chest pain, not pleuritic. He was unable to note if it was exertional as he just sat still until it resolved.   Past Medical History:  Diagnosis Date  . Bulging lumbar disc   . Lumbar vertebral fracture (HCC)      There are no active problems to display for this patient.    Past Surgical History:  Procedure Laterality Date  . ARM WOUND REPAIR / CLOSURE Right    arm fracture repair  . CHOLECYSTECTOMY    . FRACTURE SURGERY     Right arm     Prior to Admission medications   Medication Sig Start Date End Date Taking? Authorizing Provider  HYDROcodone-acetaminophen (NORCO/VICODIN) 5-325 MG tablet Take 1 tablet by mouth every 4 (four) hours as needed for moderate pain. Patient not taking: Reported on 03/21/2016 08/28/15   [name], MD  naproxen (NAPROSYN) 500 MG tablet Take 1  tablet (500 mg total) by mouth 2 (two) times daily with a meal. 06/08/15 06/07/16  Chinita Pester, FNP  naproxen (NAPROSYN) 500 MG tablet Take 1 tablet (500 mg total) by mouth 2 (two) times daily with a meal. Patient not taking: Reported on 03/21/2016 03/11/16   Joni Reining, PA-C  oxyCODONE-acetaminophen (PERCOCET) 7.5-325 MG tablet Take 1 tablet by mouth every 4 (four) hours as needed for severe pain. Patient not taking: Reported on 03/21/2016 03/11/16   Joni Reining, PA-C  oxyCODONE-acetaminophen (PERCOCET/ROXICET) 5-325 MG tablet Take 1 tablet by mouth every 4 (four) hours as needed for severe pain. Patient not taking: Reported on 03/21/2016 11/29/15   Darci Current, MD  oxyCODONE-acetaminophen (ROXICET) 5-325 MG tablet Take 1 tablet by mouth every 6 (six) hours as needed. Patient not taking: Reported on 03/21/2016 06/08/15   Chinita Pester, FNP     Allergies Tramadol and Benadryl [diphenhydramine hcl (sleep)]   History reviewed. No pertinent family history.  Social History Social History  Substance Use Topics  . Smoking status: Never Smoker  . Smokeless tobacco: Never Used  . Alcohol use Yes     Comment: occaisonly    Review of Systems  Constitutional:   No fever or chills.  ENT:   No sore throat. No rhinorrhea. Cardiovascular:   Positive as above chest pain. Respiratory:   No dyspnea or cough. Gastrointestinal:   Negative for abdominal pain, vomiting and diarrhea.  Neurological:   Negative for headaches 10-point ROS otherwise negative.  ____________________________________________   PHYSICAL EXAM:  VITAL SIGNS: ED Triage Vitals  Enc Vitals Group     BP 03/21/16 1534 (!) 141/92     Pulse Rate 03/21/16 1534 (!) 106     Resp 03/21/16 1534 15     Temp 03/21/16 1534 98 F (36.7 C)     Temp Source 03/21/16 1534 Oral     SpO2 03/21/16 1534 96 %     Weight 03/21/16 1535 240 lb (108.9 kg)     Height 03/21/16 1535 5\' 10"  (1.778 m)     Head Circumference --      Peak  Flow --      Pain Score 03/21/16 1535 10     Pain Loc --      Pain Edu? --      Excl. in GC? --     Vital signs reviewed, nursing assessments reviewed.   Constitutional:   Alert and oriented. Well appearing and in no distress. Eyes:   No scleral icterus. No conjunctival pallor. PERRL. EOMI.  No nystagmus. ENT   Head:   Normocephalic and atraumatic.   Nose:   No congestion/rhinnorhea. No septal hematoma   Mouth/Throat:   MMM, no pharyngeal erythema. No peritonsillar mass.    Neck:   No stridor. No SubQ emphysema. No meningismus.Full range of motion Hematological/Lymphatic/Immunilogical:   No cervical lymphadenopathy. Cardiovascular:   RRR. Symmetric bilateral radial and DP pulses.  No murmurs.  Respiratory:   Normal respiratory effort without tachypnea nor retractions. Breath sounds are clear and equal bilaterally. No wheezes/rales/rhonchi.Chest wall nontender Gastrointestinal:   Soft and nontender. Non distended. There is no CVA tenderness.  No rebound, rigidity, or guarding. Genitourinary:   deferred Musculoskeletal:   Right shoulder diffusely tender around the joint line scapula and anterior axilla. Intact range of motion. No dislocation clinically. Other extremity is unremarkable. No midline spinal tenderness. Neurologic:   Normal speech and language.  CN 2-10 normal. Motor grossly intact. No gross focal neurologic deficits are appreciated.  Skin:    Skin is warm, dry and intact. No rash noted.  No petechiae, purpura, or bullae.  ____________________________________________    LABS (pertinent positives/negatives) (all labs ordered are listed, but only abnormal results are displayed) Labs Reviewed  COMPREHENSIVE METABOLIC PANEL - Abnormal; Notable for the following:       Result Value   Glucose, Bld 104 (*)    AST 43 (*)    All other components within normal limits  CBC WITH DIFFERENTIAL/PLATELET - Abnormal; Notable for the following:    Platelets 146 (*)     All other components within normal limits  TROPONIN I - Abnormal; Notable for the following:    Troponin I 0.03 (*)    All other components within normal limits  LIPASE, BLOOD   ____________________________________________   EKG  Interpreted by me Sinus tachycardia rate 106, normal axis intervals QRS ST segments and T waves  ____________________________________________    RADIOLOGY  Chest x-ray unremarkable X-ray right shoulder unremarkable  ____________________________________________   PROCEDURES Procedures  ____________________________________________   INITIAL IMPRESSION / ASSESSMENT AND PLAN / ED COURSE  Pertinent labs & imaging results that were available during my care of the patient were reviewed by me and considered in my medical decision making (see chart for details).  Patient presents with palpitations syncope and chest pain. Chest pain pattern is somewhat atypical but overall concerning for cardiac etiology. We'll maintain on monitor, check  labs, plan for further in-hospital evaluation and risk stratification. IV fluids for hydration. Oral aspirin. Low suspicion for dissection PE carditis pneumothorax or AAA.     Clinical Course    ----------------------------------------- 6:00 PM on 03/21/2016 -----------------------------------------  Vital signs stable. Chest pain resolved. Case discussed with hospitalist for further evaluation and management. Still having shoulder pain, we'll try IV Toradol for pain relief as morphine had no effect. ____________________________________________   FINAL CLINICAL IMPRESSION(S) / ED DIAGNOSES  Final diagnoses:  Precordial pain  Syncope, unspecified syncope type       Portions of this note were generated with dragon dictation software. Dictation errors may occur despite best attempts at proofreading.    Sharman Cheek, MD 03/21/16 [phone]

## 2016-03-21 NOTE — H&P (Signed)
[facility] [place] [facility]   PATIENT NAME: Randy CashingCharles Jordan    MR#:  [id number]  DATE OF BIRTH:  10/31/1960  DATE OF ADMISSION:  03/21/2016  PRIMARY CARE PHYSICIAN: No PCP Per Patient   REQUESTING/REFERRING PHYSICIAN: Dr. Scotty CourtStafford  CHIEF COMPLAINT: Syncope    Chief Complaint  Patient presents with  . Chest Pain    HISTORY OF PRESENT ILLNESS:  Randy Jordan  is a 55 y.o. male with a History of for chronic back pain was mowing his mother's lawn ,he was feeling dizzy, went inside to get water, drank cold water after that he passed out. Mother woke him up, he passed out for about 5 minutes, brought in by EMS. Before EMS came patient had sweating, left-sided chest pain, vomiting. The patient did not have this problem before. No prior cardiac workup. No premature coronary artery disease in the family. No chest pain at this time. No other complaints, no shortness of breath, no dizziness.  PAST MEDICAL HISTORY:   Past Medical History:  Diagnosis Date  . Bulging lumbar disc   . Lumbar vertebral fracture (HCC)     PAST SURGICAL HISTOIRY:   Past Surgical History:  Procedure Laterality Date  . ARM WOUND REPAIR / CLOSURE Right    arm fracture repair  . CHOLECYSTECTOMY    . FRACTURE SURGERY     Right arm    SOCIAL HISTORY:   Social History  Substance Use Topics  . Smoking status: Never Smoker  . Smokeless tobacco: Never Used  . Alcohol use Yes     Comment: occaisonly    FAMILY HISTORY:  History reviewed. No pertinent family history.  DRUG ALLERGIES:   Allergies  Allergen Reactions  . Tramadol Hives  . Benadryl [Diphenhydramine Hcl (Sleep)] Rash    Patient states he get red dots all over him. Reminds him of chicken pox.    REVIEW OF SYSTEMS:  CONSTITUTIONAL: No fever, fatigue or weakness.  EYES: No blurred or double vision.  EARS, NOSE, AND THROAT: No tinnitus or ear pain.  RESPIRATORY: No cough, shortness of breath, wheezing  or hemoptysis.  CARDIOVASCULAR:Chest pain, nausea, vomiting today. And also had syncope.Marland Kitchen.  GASTROINTESTINAL: nausea, vomiting, GENITOURINARY: No dysuria, hematuria.  ENDOCRINE: No polyuria, nocturia,  HEMATOLOGY: No anemia, easy bruising or bleeding SKIN: No rash or lesion. MUSCULOSKELETAL: No joint pain or arthritis.   NEUROLOGIC: No tingling, numbness, weakness.  PSYCHIATRY: No anxiety or depression.   MEDICATIONS AT HOME:   Prior to Admission medications   Medication Sig Start Date End Date Taking? Authorizing Provider  HYDROcodone-acetaminophen (NORCO/VICODIN) 5-325 MG tablet Take 1 tablet by mouth every 4 (four) hours as needed for moderate pain. Patient not taking: Reported on 03/21/2016 08/28/15   Jene Everyobert Kinner, MD  naproxen (NAPROSYN) 500 MG tablet Take 1 tablet (500 mg total) by mouth 2 (two) times daily with a meal. 06/08/15 06/07/16  Chinita Pesterari B Triplett, FNP  naproxen (NAPROSYN) 500 MG tablet Take 1 tablet (500 mg total) by mouth 2 (two) times daily with a meal. Patient not taking: Reported on 03/21/2016 03/11/16   Joni Reiningonald K Smith, PA-C  oxyCODONE-acetaminophen (PERCOCET) 7.5-325 MG tablet Take 1 tablet by mouth every 4 (four) hours as needed for severe pain. Patient not taking: Reported on 03/21/2016 03/11/16   Joni Reiningonald K Smith, PA-C  oxyCODONE-acetaminophen (PERCOCET/ROXICET) 5-325 MG tablet Take 1 tablet by mouth every 4 (four) hours as needed for severe pain. Patient not taking: Reported on 03/21/2016 11/29/15   Enedina Finnerandolph N  Manson Passey, MD  oxyCODONE-acetaminophen (ROXICET) 5-325 MG tablet Take 1 tablet by mouth every 6 (six) hours as needed. Patient not taking: Reported on 03/21/2016 06/08/15   Chinita Pester, FNP      VITAL SIGNS:  Blood pressure (!) 130/94, pulse 71, temperature 98 F (36.7 C), temperature source Oral, resp. rate 12, height 5\' 10"  (1.778 m), weight 108.9 kg (240 lb), SpO2 98 %.  PHYSICAL EXAMINATION:  GENERAL:  55 y.o.-year-old patient lying in the bed with no acute  distress.  EYES: Pupils equal, round, reactive to light and accommodation. No scleral icterus. Extraocular muscles intact.  HEENT: Head atraumatic, normocephalic. Oropharynx and nasopharynx clear.  NECK:  Supple, no jugular venous distention. No thyroid enlargement, no tenderness.  LUNGS: Normal breath sounds bilaterally, no wheezing, rales,rhonchi or crepitation. No use of accessory muscles of respiration.  CARDIOVASCULAR: S1, S2 normal. No murmurs, rubs, or gallops.  ABDOMEN: Soft, nontender, nondistended. Bowel sounds present. No organomegaly or mass.  EXTREMITIES: No pedal edema, cyanosis, or clubbing.  NEUROLOGIC: Cranial nerves II through XII are intact. Muscle strength 5/5 in all extremities. Sensation intact. Gait not checked.  PSYCHIATRIC: The patient is alert and oriented x 3.  SKIN: No obvious rash, lesion, or ulcer.   LABORATORY PANEL:   CBC  Recent Labs Lab 03/21/16 1552  WBC 9.2  HGB 15.3  HCT 44.0  PLT 146*   ------------------------------------------------------------------------------------------------------------------  Chemistries   Recent Labs Lab 03/21/16 1552  NA 138  K 3.5  CL 107  CO2 22  GLUCOSE 104*  BUN 16  CREATININE 1.17  CALCIUM 9.3  AST 43*  ALT 60  ALKPHOS 70  BILITOT 0.7   ------------------------------------------------------------------------------------------------------------------  Cardiac Enzymes  Recent Labs Lab 03/21/16 1552  TROPONINI 0.03*   ------------------------------------------------------------------------------------------------------------------  RADIOLOGY:  Dg Chest 2 View  Result Date: 03/21/2016 CLINICAL DATA:  Chest pain beginning this afternoon while mowing, syncopal episode after he went inside, unwitnessed fall, RIGHT shoulder pain EXAM: CHEST  2 VIEW COMPARISON:  11/28/2015 FINDINGS: Minimal enlargement of cardiac silhouette. Mediastinal contours and pulmonary vascularity normal. Chronic bronchitic  changes and accentuation of interstitial markings without acute consolidation, pleural effusion or pneumothorax. Bones demineralized with scattered degenerative disc disease changes thoracic spine. Chronic compression fracture of a lower thoracic vertebra unchanged. IMPRESSION: Minimal enlargement of cardiac silhouette. Chronic bronchitic changes without infiltrate. Electronically Signed   By: Ulyses Southward M.D.   On: 03/21/2016 16:48   Dg Shoulder Right  Result Date: 03/21/2016 CLINICAL DATA:  Status post syncope and fall today with a blow to the right shoulder. Initial encounter. EXAM: RIGHT SHOULDER - 2+ VIEW COMPARISON:  None. FINDINGS: No acute bony or joint abnormality is identified. Remote healed clavicle fracture is noted. Mild acromioclavicular degenerative change is seen. IMPRESSION: No acute abnormality. Remote healed clavicle fracture. Electronically Signed   By: Drusilla Kanner M.D.   On: 03/21/2016 16:47    EKG:   Orders placed or performed during the hospital encounter of 03/21/16  . ED EKG  . ED EKG  . EKG 12-Lead  . EKG 12-Lead  Sinus tachycardia at 106 minute no ST-T changes.  IMPRESSION AND PLAN:   #1 syncope with chest pain, palpitations ,sweating, vomiting:: Overall, events concerning for cardiac event. Admitted to telemetry, treated on aspirin, beta blockers, nitrates. Cycle the cardiac enzymes, check echocardiogram, consult cardiology, follow their recommendations, check orthostatic vitals. Check fasting lipids, #2 patient has chronic low back pain: Uses Percocet as needed. GI, DVT prophylaxis.    All the  records are reviewed and case discussed with ED provider. Management plans discussed with the patient, family and they are in agreement.  CODE STATUS: full  TOTAL TIME TAKING CARE OF THIS PATIENT: 55 minutes.    Katha Hamming M.D on 03/21/2016 at 6:52 PM  Between 7am to 6pm - Pager - [phone]  After 6pm go to [email or web address] - password [email or web address]  [facility]  [place] [facility]  Office  [phone]  CC: Primary care physician; No PCP Per Patient  Note: This dictation was prepared with Dragon dictation along with smaller phrase technology. Any transcriptional errors that result from this process are unintentional.

## 2016-03-21 NOTE — ED Triage Notes (Signed)
Pt to ED via EMS from home c/o chest pain that started this afternoon.  Pt reports mowing outside and started having chest pain, patient then went inside and had a syncopal episode and unwitnessed fall pain to right shoulder.  Chest pain described as central sharp pain, diaphoretic, palpitations, nausea and vomiting x1.  Pt presents A&Ox4, speaking in complete and coherent sentences, denies CP on arrival, 10/10 shoulder pain, chest rise even and unlabored.

## 2016-03-22 ENCOUNTER — Encounter: Payer: Self-pay | Admitting: Nurse Practitioner

## 2016-03-22 ENCOUNTER — Observation Stay (HOSPITAL_BASED_OUTPATIENT_CLINIC_OR_DEPARTMENT_OTHER)
Admit: 2016-03-22 | Discharge: 2016-03-22 | Disposition: A | Discharge status: Home / Self Care | Payer: Self-pay | Attending: Internal Medicine | Admitting: Internal Medicine

## 2016-03-22 ENCOUNTER — Telehealth: Payer: Self-pay

## 2016-03-22 DIAGNOSIS — R55 Syncope and collapse: Secondary | ICD-10-CM

## 2016-03-22 LAB — GLUCOSE, CAPILLARY: GLUCOSE-CAPILLARY: 106 mg/dL — AB (ref 65–99)

## 2016-03-22 LAB — URINE DRUG SCREEN, QUALITATIVE (ARMC ONLY)
Amphetamines, Ur Screen: NOT DETECTED
Barbiturates, Ur Screen: NOT DETECTED
Benzodiazepine, Ur Scrn: NOT DETECTED
CANNABINOID 50 NG, UR ~~LOC~~: NOT DETECTED
Cocaine Metabolite,Ur ~~LOC~~: POSITIVE — AB
MDMA (ECSTASY) UR SCREEN: NOT DETECTED
Methadone Scn, Ur: NOT DETECTED
Opiate, Ur Screen: POSITIVE — AB
PHENCYCLIDINE (PCP) UR S: NOT DETECTED
Tricyclic, Ur Screen: NOT DETECTED

## 2016-03-22 LAB — BASIC METABOLIC PANEL
ANION GAP: 4 — AB (ref 5–15)
BUN: 24 mg/dL — ABNORMAL HIGH (ref 6–20)
CHLORIDE: 109 mmol/L (ref 101–111)
CO2: 27 mmol/L (ref 22–32)
CREATININE: 1.22 mg/dL (ref 0.61–1.24)
Calcium: 8.9 mg/dL (ref 8.9–10.3)
GFR calc non Af Amer: >60 mL/min (ref >60)
Glucose, Bld: 161 mg/dL — ABNORMAL HIGH (ref 65–99)
POTASSIUM: 3.6 mmol/L (ref 3.5–5.1)
SODIUM: 140 mmol/L (ref 135–145)

## 2016-03-22 LAB — CBC
HEMATOCRIT: 39.9 % — AB (ref 40.0–52.0)
HEMOGLOBIN: 13.8 g/dL (ref 13.0–18.0)
MCH: 32.4 pg (ref 26.0–34.0)
MCHC: 34.6 g/dL (ref 32.0–36.0)
MCV: 93.6 fL (ref 80.0–100.0)
PLATELETS: 137 10*3/uL — AB (ref 150–440)
RBC: 4.26 MIL/uL — AB (ref 4.40–5.90)
RDW: 13 % (ref 11.5–14.5)
WBC: 7 10*3/uL (ref 3.8–10.6)

## 2016-03-22 LAB — ECHOCARDIOGRAM COMPLETE
Height: 70 in
WEIGHTICAEL: 3836.8 [oz_av]

## 2016-03-22 LAB — HEMOGLOBIN A1C
Hgb A1c MFr Bld: 6.1 % — ABNORMAL HIGH (ref 4.8–5.6)
MEAN PLASMA GLUCOSE: 128 mg/dL

## 2016-03-22 LAB — TROPONIN I: Troponin I: <0.03 ng/mL (ref <0.03)

## 2016-03-22 MED ORDER — ASPIRIN 81 MG PO TBEC: 81.0000 mg | DELAYED_RELEASE_TABLET | Freq: Every day | ORAL | 0 refills | Status: AC

## 2016-03-22 NOTE — Telephone Encounter (Signed)
-----   Message from Coralee RudSabrina F Gilley sent at 03/22/2016 12:33 PM EDT ----- Regarding: tcm/ph 10/11 2:00 Eula Listenyan Dunn, PA

## 2016-03-22 NOTE — Care Management (Signed)
Placed in observation for syncope.  Work up so far is negative.  Do not anticipate any discharge needs

## 2016-03-22 NOTE — Progress Notes (Signed)
*  PRELIMINARY RESULTS* Echocardiogram 2D Echocardiogram has been performed.  Cristela BlueHege, Domonik Levario 03/22/2016, 7:56 AM

## 2016-03-22 NOTE — Consult Note (Signed)
Cardiology Consult    Patient ID: Randy Jordan MRN: [id number], DOB/AGE: 02-04-1961   Admit date: 03/21/2016 Date of Consult: 03/22/2016  Primary Physician: No PCP Per Patient Primary Cardiologist: new - seen by M. Kirke Corin, MD  Requesting Provider: S. Sudini  Patient Profile    55 year old male without prior cardiac history who was admitted September 21 following a syncopal episode with development of chest pain.  Past Medical History   Past Medical History:  Diagnosis Date  . Bulging lumbar disc   . Chronic back pain   . Cocaine abuse   . Lumbar vertebral fracture [facility])     Past Surgical History:  Procedure Laterality Date  . ARM WOUND REPAIR / CLOSURE Right    arm fracture repair  . CHOLECYSTECTOMY    . FRACTURE SURGERY     Right arm     Allergies  Allergies  Allergen Reactions  . Tramadol Hives  . Benadryl [Diphenhydramine Hcl (Sleep)] Rash    Patient states he get red dots all over him. Reminds him of chicken pox.    History of Present Illness    55 year old male without prior cardiac history. He has chronic low back pain in the setting of bulging lumbar disks. He has multiple ER visits related to this.  He lives locally with his parents. He is not particularly active but does do work outside such as Investment banker, corporate without any significant limitations usually. He was in his usual state of health until September 21, when he was mowing his mother's lawn at about 2 PM. He had been mowing for about 45 minutes and it was quite hot. In that setting, he began to feel lightheaded. He went inside and got a drink of water and sat down at the kitchen table. He says his vision felt like he was cutting in and out and he suffered a syncopal episode, falling to the floor. He has no recollection of the fall. He isn't sure if he tried to stand up before he fell if he simply fell off the chair. His mother heard him fall but did not see it. She was on the phone at the time and  after hanging up, came to him and tried to arouse him but he was not immediately arousable. He apparently was breathing. After about 5 minutes per her estimate, he regained consciousness. Upon regaining consciousness, he complained of nausea and also left-sided chest discomfort. He vomited. EMS was called and he was taken into the ED. Here, ECG showed sinus tachycardia without acute ST or T changes. Troponin was normal though third troponin was minimally elevated at 0.03. He has not had any further chest pain. Echo performed today showed normal LV function. He is eager to go home. Of note, urine drug screen was positive for cocaine and opiates. He denies using drugs other than prn percocet for back pain, though family is in the room at this time.  Inpatient Medications    . aspirin EC  81 mg Oral Daily  . docusate sodium  100 mg Oral BID  . enoxaparin (LOVENOX) injection  40 mg Subcutaneous Q24H  . metoprolol tartrate  25 mg Oral BID  . naproxen  500 mg Oral BID WC  . nitroGLYCERIN  0.5 inch Topical Q6H    Family History    Family History  Problem Relation Age of Onset  . Heart disease Mother   . CAD Father     Social History    Social History  Social History  . Marital status: Single    Spouse name: N/A  . Number of children: N/A  . Years of education: N/A   Occupational History  . Not on file.   Social History Main Topics  . Smoking status: Never Smoker  . Smokeless tobacco: Never Used  . Alcohol use Yes     Comment: occaisonly  . Drug use:      Comment: 03/2016: Pt denies but UDS + cocaine.  . Sexual activity: Not on file   Other Topics Concern  . Not on file   Social History Narrative  . No narrative on file     Review of Systems    General:  No chills, fever, night sweats or weight changes.  Cardiovascular:  +++ chest pain, +++ dyspnea on exertion over the past year, no edema, orthopnea, palpitations, paroxysmal nocturnal dyspnea. Dermatological: No rash,  lesions/masses Respiratory: No cough, +++ dyspnea on exertion over the past year. Urologic: No hematuria, dysuria Abdominal:   +++ nausea, vomiting following syncope on 9/21.  No diarrhea, bright red blood per rectum, melena, or hematemesis Neurologic:  No visual changes, wkns, changes in mental status. All other systems reviewed and are otherwise negative except as noted above.  Physical Exam    Blood pressure 108/65, pulse 65, temperature 98 F (36.7 C), temperature source Oral, resp. rate 12, height 5\' 10"  (1.778 m), weight 239 lb 12.8 oz (108.8 kg), SpO2 96 %.  General: Pleasant, NAD Psych: Normal affect. Neuro: Alert and oriented X 3. Moves all extremities spontaneously. HEENT: Normal  Neck: Supple without bruits or JVD. Lungs:  Resp regular and unlabored, CTA. Heart: RRR no s3, s4, or murmurs. Abdomen: Soft, non-tender, non-distended, BS + x 4.  Extremities: No clubbing, cyanosis or edema. DP/PT/Radials 2+ and equal bilaterally.  Labs     Recent Labs  03/21/16 1552 03/21/16 1914 03/22/16 0045  TROPONINI 0.03* <0.03 <0.03   Lab Results  Component Value Date   WBC 7.0 03/22/2016   HGB 13.8 03/22/2016   HCT 39.9 (L) 03/22/2016   MCV 93.6 03/22/2016   PLT 137 (L) 03/22/2016    Recent Labs Lab 03/21/16 1552  03/22/16 0045  NA 138  --  140  K 3.5  --  3.6  CL 107  --  109  CO2 22  --  27  BUN 16  --  24*  CREATININE 1.17  < > 1.22  CALCIUM 9.3  --  8.9  PROT 7.8  --   --   BILITOT 0.7  --   --   ALKPHOS 70  --   --   ALT 60  --   --   AST 43*  --   --   GLUCOSE 104*  --  161*  < > = values in this interval not displayed.   Radiology Studies    Dg Chest 2 View  Result Date: 03/21/2016 CLINICAL DATA:  Chest pain beginning this afternoon while mowing, syncopal episode after he went inside, unwitnessed fall, RIGHT shoulder pain EXAM: CHEST  2 VIEW COMPARISON:  11/28/2015 FINDINGS: Minimal enlargement of cardiac silhouette. Mediastinal contours and pulmonary  vascularity normal. Chronic bronchitic changes and accentuation of interstitial markings without acute consolidation, pleural effusion or pneumothorax. Bones demineralized with scattered degenerative disc disease changes thoracic spine. Chronic compression fracture of a lower thoracic vertebra unchanged. IMPRESSION: Minimal enlargement of cardiac silhouette. Chronic bronchitic changes without infiltrate. Electronically Signed   By: Ulyses Southward M.D.   On:  03/21/2016 16:48   Dg Shoulder Right  Result Date: 03/21/2016 CLINICAL DATA:  Status post syncope and fall today with a blow to the right shoulder. Initial encounter. EXAM: RIGHT SHOULDER - 2+ VIEW COMPARISON:  None. FINDINGS: No acute bony or joint abnormality is identified. Remote healed clavicle fracture is noted. Mild acromioclavicular degenerative change is seen. IMPRESSION: No acute abnormality. Remote healed clavicle fracture. Electronically Signed   By: Drusilla Kannerhomas  Dalessio M.D.   On: 03/21/2016 16:47    ECG & Cardiac Imaging    Sinus Tach, PAC's, 106, no acute st/t changes.  2D Echocardiogram 9.22.2017  Study Conclusions   - Left ventricle: The cavity size was normal. There was moderate   concentric hypertrophy. Systolic function was normal. The   estimated ejection fraction was in the range of 55% to 60%. Wall   motion was normal; there were no regional wall motion   abnormalities. Doppler parameters are consistent with abnormal   left ventricular relaxation (grade 1 diastolic dysfunction). - Left atrium: The atrium was mildly dilated.  Assessment & Plan    1. Syncope: Patient was admitted on September 21 following a syncopal episode that occurred shortly after mowing his mother's yard. He notes that it was quite hot outside and he had been working at it for about 45 minutes. He isn't sure if he hydrated well prior to the episode. He was unresponsive for possibly 5 minutes by his mother's estimation. When he regained consciousness, he  developed nausea with vomiting and also left-sided chest pain. In the emergency department, ECG was only notable for sinus tachycardia. There were no acute ST or T changes. Initial 2 troponins were normal the third troponin was minimally elevated at 0.03. He has not had any recurrence of chest pain. Echo performed today showed normal LV function. He has had no events on telemetry. Urine drug screen was positive for cocaine the patient currently denies using drugs. Hard to know what role cocaine and plating his syncope. In light of normal LV function without evidence of high-grade heart block or ventricular arrhythmias on telemetry, he can be safely discharged. We can arrange for a 30 day event monitor and outpatient exercise Myoview to rule out ischemia.  2. Left-sided chest pain: See #1. He also reports a one-year history of dyspnea on exertion. Echo showed normal LV function. We can arrange for an outpatient exercise Myoview. He does have a family history of coronary artery disease but no premature CAD.  3. Cocaine abuse: Patient currently denies using drugs. He has family members in the room and I did not want to close the findings of the urine drug screen with them present. He will need formal counseling in a more private setting.  4. Chronic back pain: He uses when necessary Percocet with multiple ER visits over the years. He is asking if we will give him Percocet at discharge. I advised that I will not be.  Signed, Nicolasa Duckinghristopher Eames Dibiasio, NP 03/22/2016, 12:10 PM

## 2016-03-22 NOTE — Discharge Summary (Signed)
[facility] - [place] at [facility]   PATIENT NAME: Randy Jordan    MR#:  [id number]  DATE OF BIRTH:  [birth year]  DATE OF ADMISSION:  03/21/2016 ADMITTING PHYSICIAN: Katha Hamming, MD  DATE OF DISCHARGE: 03/22/2016  PRIMARY CARE PHYSICIAN: No PCP Per Patient   ADMISSION DIAGNOSIS:  Precordial pain [R07.2] Syncope, unspecified syncope type [R55]  DISCHARGE DIAGNOSIS:  Active Problems:   Syncope   SECONDARY DIAGNOSIS:   Past Medical History:  Diagnosis Date  . Bulging lumbar disc   . Chronic back pain   . Cocaine abuse   . Lumbar vertebral fracture [facility])      ADMITTING HISTORY  Randy Jordan  is a 55 y.o. male with a History of for chronic back pain was mowing his mother's lawn ,he was feeling dizzy, went inside to get water, drank cold water after that he passed out. Mother woke him up, he passed out for about 5 minutes, brought in by EMS. Before EMS came patient had sweating, left-sided chest pain, vomiting. The patient did not have this problem before. No prior cardiac workup. No premature coronary artery disease in the family. No chest pain at this time. No other complaints, no shortness of breath, no dizziness.   HOSPITAL COURSE:   * Chest pain radiating from shoulder. Troponin has been normal. EKG showed nothing acute. Telemetry showed no arrhythmias. Seen by cardiology.  His chest pain could also be due to cocaine abuse which urine drug screen showed.  * Syncope likely due to cocaine Telemetry normal  Patient has follow-up with cardiology for an outpatient stress test.  Stable for discharge home to follow-up with primary care physician and cardiology. Start low-dose aspirin daily.  CONSULTS OBTAINED:  Treatment Team:  Iran Ouch, MD  DRUG ALLERGIES:   Allergies  Allergen Reactions  . Tramadol Hives  . Benadryl [Diphenhydramine Hcl (Sleep)] Rash    Patient states he get red dots all over him. Reminds him  of chicken pox.    DISCHARGE MEDICATIONS:   Current Discharge Medication List    START taking these medications   Details  aspirin EC 81 MG EC tablet Take 1 tablet (81 mg total) by mouth daily. Qty: 30 tablet, Refills: 0      CONTINUE these medications which have NOT CHANGED   Details  naproxen (NAPROSYN) 500 MG tablet Take 1 tablet (500 mg total) by mouth 2 (two) times daily with a meal. Qty: 20 tablet, Refills: 00      STOP taking these medications     HYDROcodone-acetaminophen (NORCO/VICODIN) 5-325 MG tablet      oxyCODONE-acetaminophen (PERCOCET) 7.5-325 MG tablet      oxyCODONE-acetaminophen (PERCOCET/ROXICET) 5-325 MG tablet      oxyCODONE-acetaminophen (ROXICET) 5-325 MG tablet         Today   VITAL SIGNS:  Blood pressure 108/65, pulse 65, temperature 98 F (36.7 C), temperature source Oral, resp. rate 12, height 5\' 10"  (1.778 m), weight 108.8 kg (239 lb 12.8 oz), SpO2 96 %.  I/O:   Intake/Output Summary (Last 24 hours) at 03/22/16 1255 Last data filed at 03/22/16 0953  Gross per 24 hour  Intake             2650 ml  Output              500 ml  Net             2150 ml    PHYSICAL EXAMINATION:  Physical Exam  GENERAL:  55 y.o.-year-old patient lying in the bed with no acute distress.  LUNGS: Normal breath sounds bilaterally, no wheezing, rales,rhonchi or crepitation. No use of accessory muscles of respiration.  CARDIOVASCULAR: S1, S2 normal. No murmurs, rubs, or gallops.  ABDOMEN: Soft, non-tender, non-distended. Bowel sounds present. No organomegaly or mass.  NEUROLOGIC: Moves all 4 extremities. PSYCHIATRIC: The patient is alert and oriented x 3.  SKIN: No obvious rash, lesion, or ulcer.   DATA REVIEW:   CBC  Recent Labs Lab 03/22/16 0045  WBC 7.0  HGB 13.8  HCT 39.9*  PLT 137*    Chemistries   Recent Labs Lab 03/21/16 1552  03/22/16 0045  NA 138  --  140  K 3.5  --  3.6  CL 107  --  109  CO2 22  --  27  GLUCOSE 104*  --  161*   BUN 16  --  24*  CREATININE 1.17  < > 1.22  CALCIUM 9.3  --  8.9  AST 43*  --   --   ALT 60  --   --   ALKPHOS 70  --   --   BILITOT 0.7  --   --   < > = values in this interval not displayed.  Cardiac Enzymes  Recent Labs Lab 03/22/16 0045  TROPONINI <0.03    Microbiology Results  No results found for this or any previous visit.  RADIOLOGY:  Dg Chest 2 View  Result Date: 03/21/2016 CLINICAL DATA:  Chest pain beginning this afternoon while mowing, syncopal episode after he went inside, unwitnessed fall, RIGHT shoulder pain EXAM: CHEST  2 VIEW COMPARISON:  11/28/2015 FINDINGS: Minimal enlargement of cardiac silhouette. Mediastinal contours and pulmonary vascularity normal. Chronic bronchitic changes and accentuation of interstitial markings without acute consolidation, pleural effusion or pneumothorax. Bones demineralized with scattered degenerative disc disease changes thoracic spine. Chronic compression fracture of a lower thoracic vertebra unchanged. IMPRESSION: Minimal enlargement of cardiac silhouette. Chronic bronchitic changes without infiltrate. Electronically Signed   By: Ulyses SouthwardMark  Boles M.D.   On: 03/21/2016 16:48   Dg Shoulder Right  Result Date: 03/21/2016 CLINICAL DATA:  Status post syncope and fall today with a blow to the right shoulder. Initial encounter. EXAM: RIGHT SHOULDER - 2+ VIEW COMPARISON:  None. FINDINGS: No acute bony or joint abnormality is identified. Remote healed clavicle fracture is noted. Mild acromioclavicular degenerative change is seen. IMPRESSION: No acute abnormality. Remote healed clavicle fracture. Electronically Signed   By: Drusilla Kannerhomas  Dalessio M.D.   On: 03/21/2016 16:47    Follow up with PCP in 1 week.  Management plans discussed with the patient, family and they are in agreement.  CODE STATUS:     Code Status Orders        Start     Ordered   03/21/16 1849  Full code  Continuous     03/21/16 1851    Code Status History    Date Active  Date Inactive Code Status Order ID Comments User Context   03/21/2016  6:52 PM 03/22/2016  7:20 AM Full Code [id number]  Katha HammingSnehalatha Konidena, MD ED      TOTAL TIME TAKING CARE OF THIS PATIENT ON DAY OF DISCHARGE: more than 30 minutes.   Milagros LollSudini, Avis Tirone R M.D on 03/22/2016 at 12:55 PM  Between 7am to 6pm - Pager - [phone]  After 6pm go to [email or web address] - password [email or web address] [facility]  [facility]  Office  [phone]  CC: Primary care physician; No PCP  Per Patient  Note: This dictation was prepared with Dragon dictation along with smaller phrase technology. Any transcriptional errors that result from this process are unintentional.

## 2016-03-22 NOTE — Telephone Encounter (Signed)
Attempted to contact pt regarding TCM appt. All numbers in pt's chart have been changed/disconnected. Unable to contact pt via phone.   Pt does not have an active MyChart account.

## 2016-03-22 NOTE — Progress Notes (Signed)
Patient alert and oriented, vss.  D/C telemetry and PIV.  F/U appt with cardiology.  Patient asking for pain medications but Dr. Elpidio AnisSudini has instructed not to give them to patient as he has discontinued them at discharge.  Patient was educated that in order to get pain medications, he needs to establish a PCP.  He verbalizes understanding.  Currently awaiting transportation to discharge from hospital.  He will be escorted out of hospital via wheelchair by volunteers.

## 2016-03-22 NOTE — Discharge Instructions (Signed)
Resumed diet and activity as before °

## 2016-03-28 IMAGING — MR MR KNEE*L* W/O CM
6 series · 37 of 40 positions shown · non-contrast
Comparison: Radiographs dated 06/08/2015

CLINICAL DATA: Left knee pain and popping since a fall on
06/08/2015.

EXAM:
MRI OF THE LEFT KNEE WITHOUT CONTRAST
TECHNIQUE: Multiplanar, multisequence MR imaging of the knee was performed. No
intravenous contrast was administered.

[Series 3: PD fat-sat · axial · 3.0mm · 0.50mm/px · z∈[-58,+58]mm · 9 of 36 slices shown (1 of 4)]
[im 1/36]
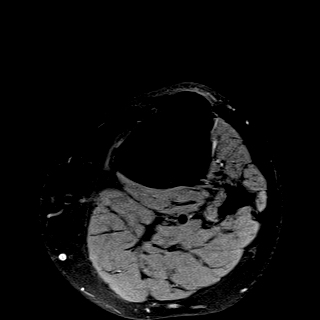
[im 5/36]
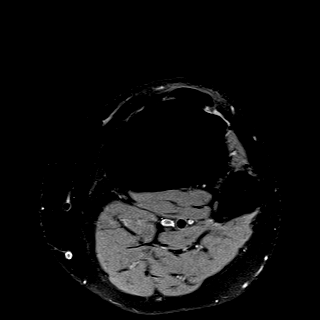
[im 9/36]
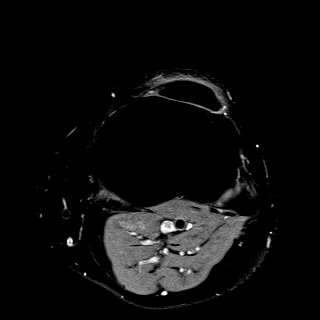
[im 14/36]
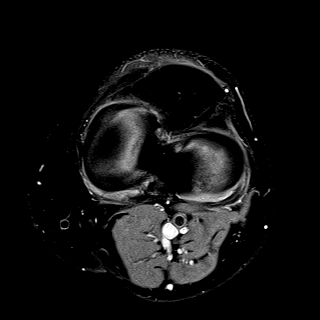
[im 18/36]
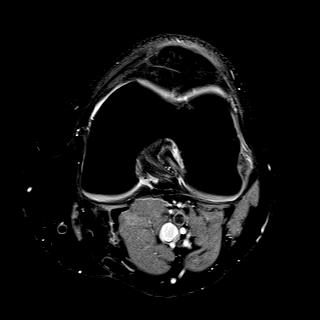
[im 22/36]
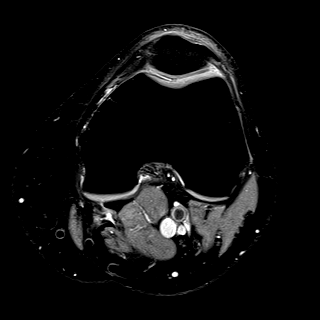
[im 27/36]
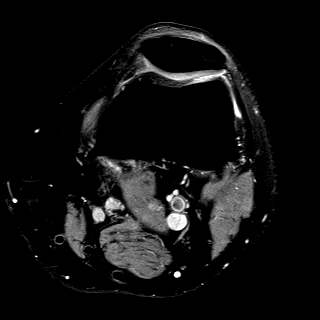
[im 31/36]
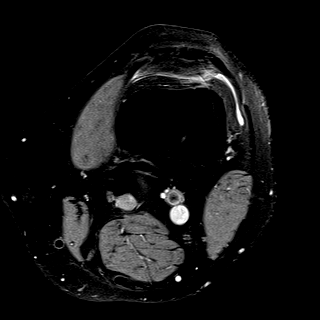
[im 36/36]
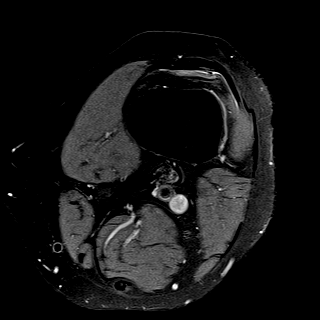

[Series 4: T1 · coronal · 3.0mm · 0.50mm/px · 4 of 32 slices shown]
[im 1/32]
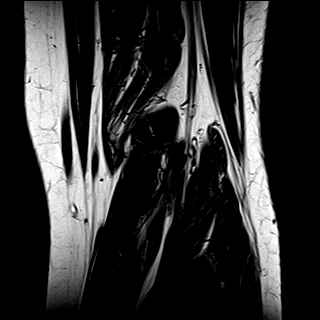
[im 6/32]
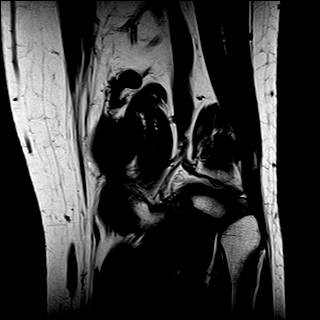
[im 11/32]
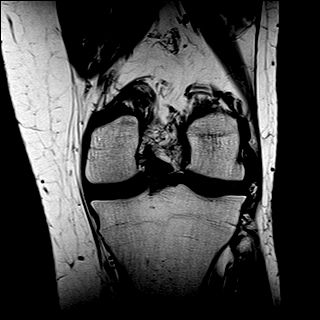
[im 16/32]
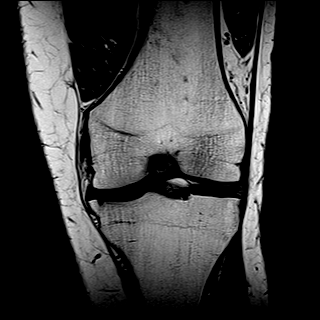

[Series 6: PD fat-sat · coronal · 3.0mm · 0.50mm/px · 7 of 32 slices shown (2 of 4)]
[im 1/32]
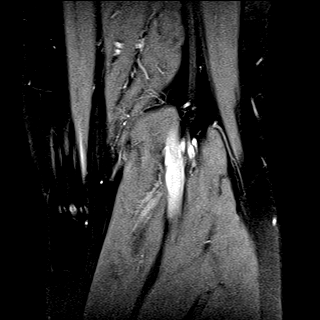
[im 6/32]
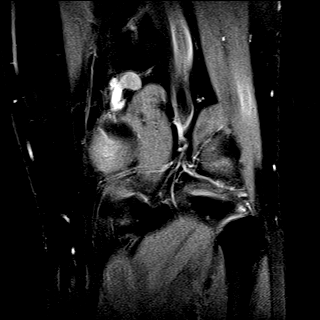
[im 11/32]
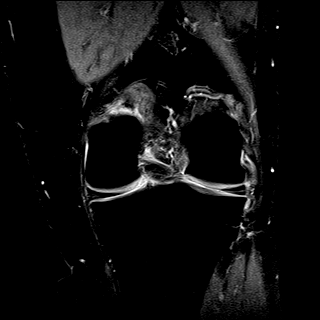
[im 16/32]
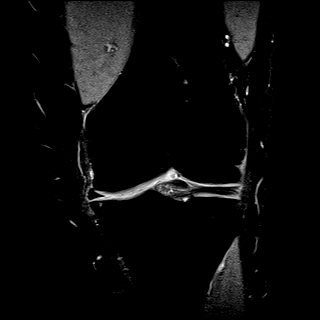
[im 21/32]
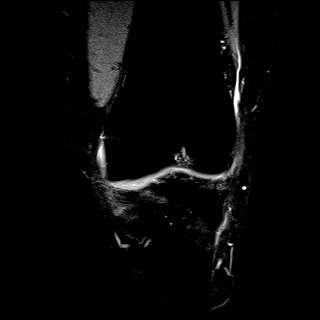
[im 26/32]
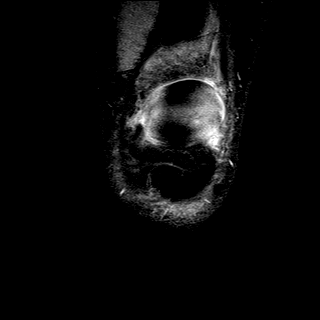
[im 32/32]
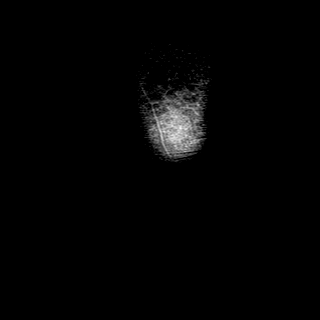

[Series 7: PD fat-sat · sagittal · 3.0mm · 0.50mm/px · 7 of 30 slices shown (3 of 4)]
[im 1/30]
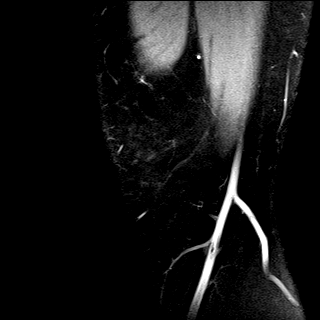
[im 5/30]
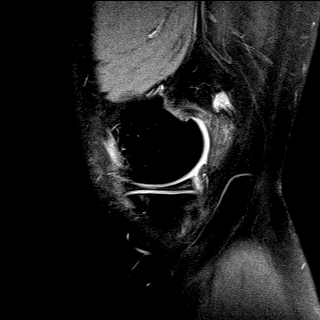
[im 10/30]
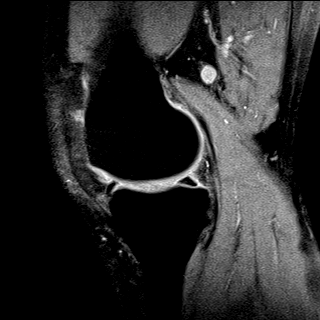
[im 15/30]
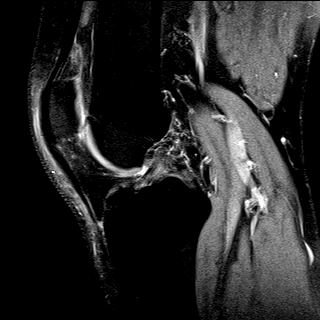
[im 20/30]
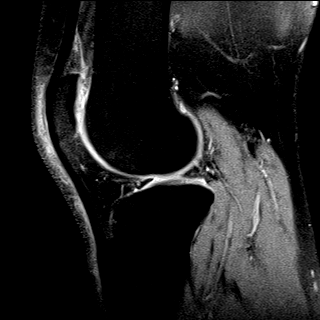
[im 25/30]
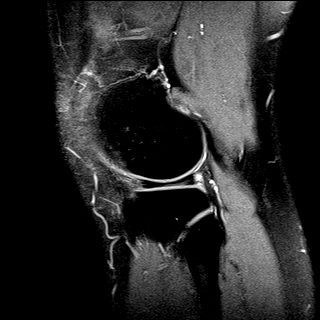
[im 30/30]
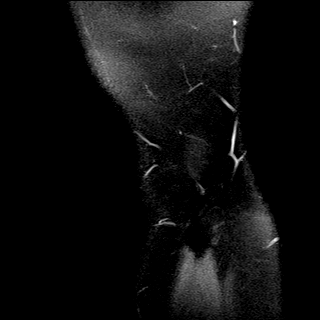

[Series 8: T2 fat-sat · coronal · 3.0mm · 0.31mm/px · 7 of 32 slices shown]
[im 1/32]
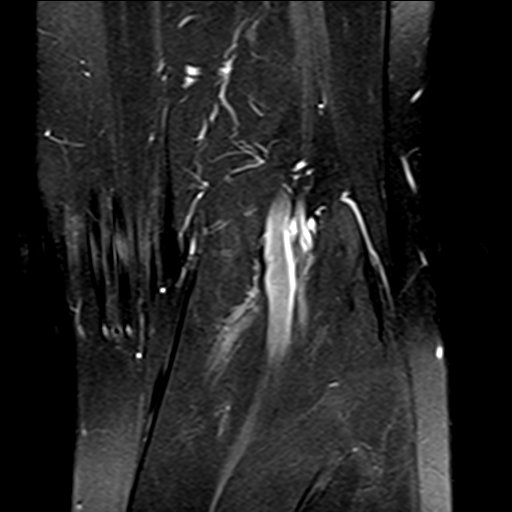
[im 6/32]
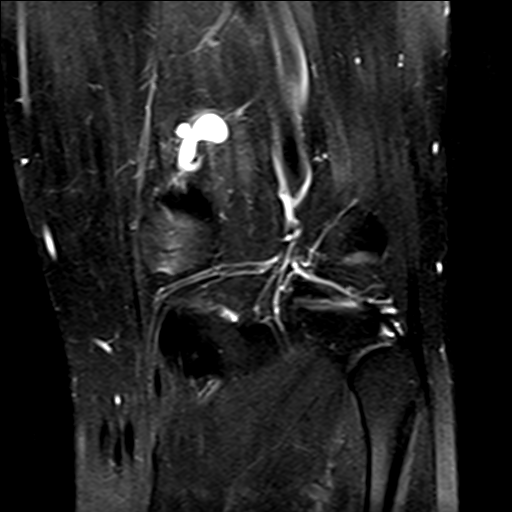
[im 11/32]
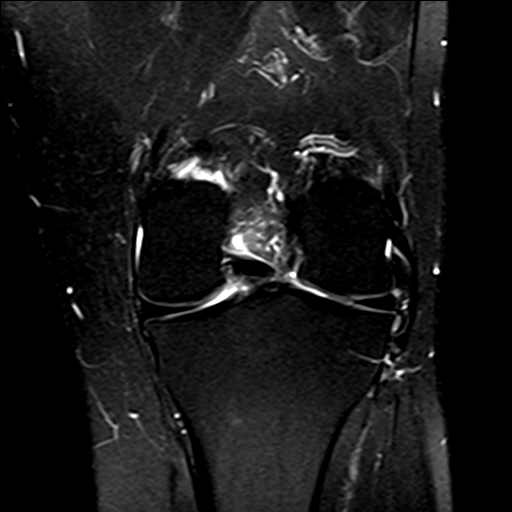
[im 16/32]
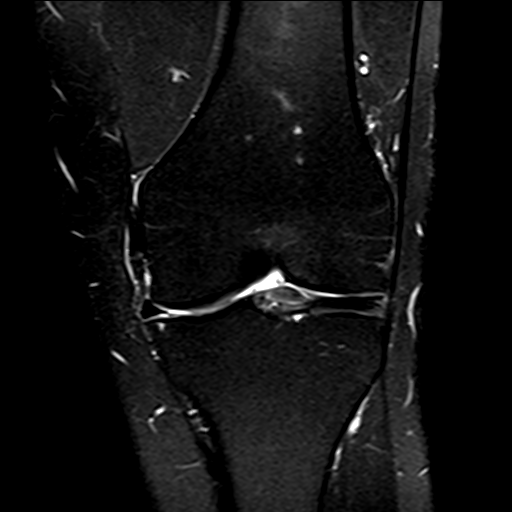
[im 21/32]
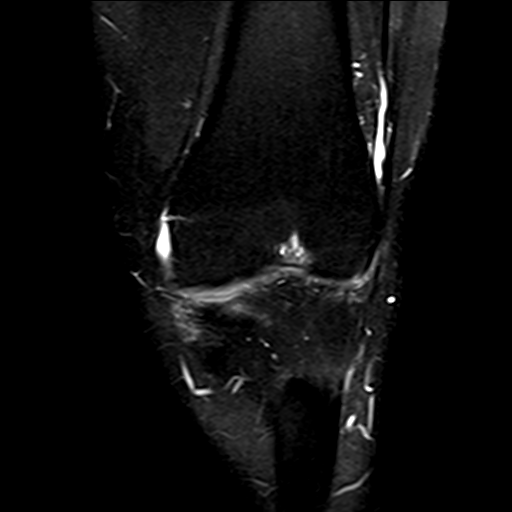
[im 26/32]
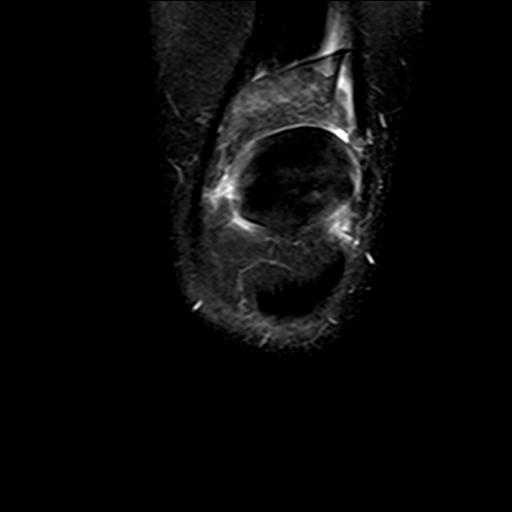
[im 32/32]
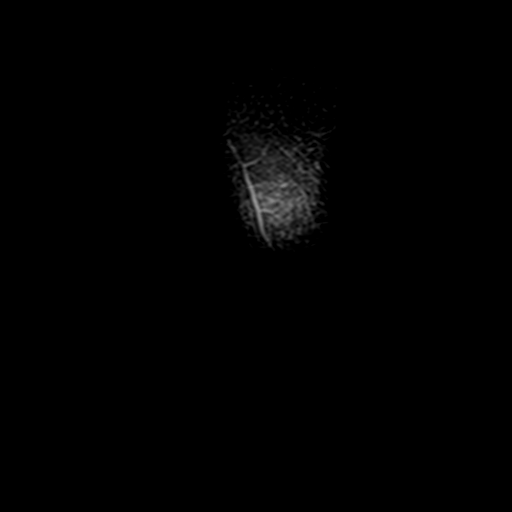

[Series 9: PD fat-sat · oblique · 2.0mm · 0.62mm/px · 3 of 13 slices shown (4 of 4)]
[im 1/13]
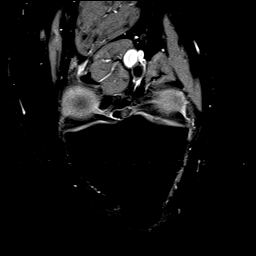
[im 7/13]
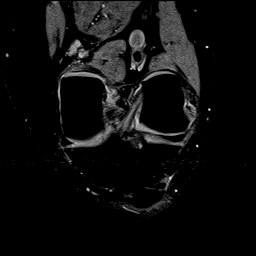
[im 13/13]
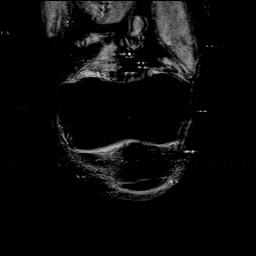

[37 of 40 positions shown; findings below may reference images not displayed]

FINDINGS: MENISCI

Medial meniscus:  Normal.

Lateral meniscus: Intrinsic degeneration of the posterior horn of
the meniscus. Horizontal signal extends to the undersurface on a
single slice, not diagnostic for a horizontal tear.

LIGAMENTS

Cruciates:  Normal.

Collaterals:  Normal.

CARTILAGE

Patellofemoral: Focal 11 mm area of grade 4 chondromalacia of the
lateral aspect of the trochlear groove of the distal femur. Tiny
focal fissure in the cartilage of the apex of the patella.

Medial: Tiny area of grade 4 chondromalacia of the periphery of the
tibial plateau.

Lateral:  Normal.

Joint:  Normal.

Popliteal Fossa:  Normal.

Extensor Mechanism: Focal degeneration of the proximal patellar
tendon. Slight nonspecific prepatellar edema.

Bones:  Extra-articular bones are normal.
IMPRESSION: 1. Chondromalacia of the patellofemoral compartment.
2. Tiny area of chondromalacia of the medial tibial plateau.
3. Slight intrinsic degeneration of the posterior horn of the
lateral meniscus.

## 2016-04-10 ENCOUNTER — Encounter: Payer: Self-pay | Admitting: *Deleted

## 2016-04-10 ENCOUNTER — Encounter: Payer: Self-pay | Admitting: Physician Assistant

## 2016-04-10 DIAGNOSIS — F1410 Cocaine abuse, uncomplicated: Secondary | ICD-10-CM | POA: Insufficient documentation

## 2016-04-10 NOTE — Progress Notes (Deleted)
Cardiology Office Note Date:  04/10/2016  Patient ID:  Randy ArchCharles L Paszkiewicz, DOB 07/17/1960, MRN [id number] PCP:  No PCP Per Patient  Cardiologist:  Dr. Kirke CorinArida, MD - never seen  ***refresh   Chief Complaint: Hospital follow up  History of Present Illness: Randy Jordan is a 55 y.o. male with history of chronic low back pain in the setting of lulging lumbar disks with multiple ER visits related to his back pain, ongoing cocaine abuse, and hyperglycemia who presents for hospital follow up after recent admission from 9/21-9/22 for syncope after mowing the lawn on hot day as well as chest pain.   He had no prior known cardiac history. He was admitted for syncope on 9/21 that occured in the setting of mowing his mother's lawn on a hot day. Syncope was felt to be in the setting of dehydration. Upon regaining consciousness there was some associated nausea, vomiting, and development of left-sided chest pain. EKG non-acute. Peak troponin of 0.03. Echo showed normal LV systolic function with an EF of 55-60%, no RWMA, GR1DD. Urine drug screen was positive for cocaine, patient denied using cocaine. He had no recurrent pain. He was discharged with outpatient follow up.   *** stress test  30-day event monitor   Past Medical History:  Diagnosis Date  . Bulging lumbar disc   . Chronic back pain   . Cocaine abuse   . Lumbar vertebral fracture (HCC)   . Syncope 03/2016   a. felt to be 2/2 dehyration    Past Surgical History:  Procedure Laterality Date  . ARM WOUND REPAIR / CLOSURE Right    arm fracture repair  . CHOLECYSTECTOMY    . FRACTURE SURGERY     Right arm    Current Outpatient Prescriptions  Medication Sig Dispense Refill  . aspirin EC 81 MG EC tablet Take 1 tablet (81 mg total) by mouth daily. 30 tablet 0  . naproxen (NAPROSYN) 500 MG tablet Take 1 tablet (500 mg total) by mouth 2 (two) times daily with a meal. (Patient not taking: Reported on 03/21/2016) 20 tablet 00   No  current facility-administered medications for this visit.     Allergies:   Tramadol and Benadryl [diphenhydramine hcl (sleep)]   Social History:  The patient  reports that he has never smoked. He has never used smokeless tobacco. He reports that he drinks alcohol. He reports that he uses drugs, including Cocaine.   Family History:  The patient's family history includes CAD in his father; Heart disease in his mother.  ROS:   ROS   PHYSICAL EXAM: *** VS:  There were no vitals taken for this visit. BMI: There is no height or weight on file to calculate BMI.  Physical Exam   EKG:  Was ordered and interpreted by me today. Shows ***  Recent Labs: 03/21/2016: ALT 60 03/22/2016: BUN 24; Creatinine, Ser 1.22; Hemoglobin 13.8; Platelets 137; Potassium 3.6; Sodium 140  No results found for requested labs within last 8760 hours.   CrCl cannot be calculated (Unknown ideal weight.).   Wt Readings from Last 3 Encounters:  03/22/16 239 lb 12.8 oz (108.8 kg)  03/11/16 240 lb (108.9 kg)  11/28/15 250 lb (113.4 kg)     Other studies reviewed: Additional studies/records reviewed today include: summarized above  ASSESSMENT AND PLAN:  1. History of syncope: Felt to be in the setting of dehydration with associated mowing the lawn in high heat. *** 2. Chest pain with moderate risk for cardiac  etiology: 3. Cocaine abuse: Recommend updated urine drug screen prior to any invasive cardiac procedure. *** 4. Chronic back pain:  Disposition: F/u with *** in   Current medicines are reviewed at length with the patient today.  The patient did not have any concerns regarding medicines.  Elinor Dodge PA-C 04/10/2016 10:52 AM     [facility] [place] [facility] [address] [place], [place] [address] [phone]

## 2016-05-27 ENCOUNTER — Emergency Department
Admission: EM | Admit: 2016-05-27 | Discharge: 2016-05-27 | Disposition: A | Discharge status: Home / Self Care | Payer: Self-pay | Attending: Emergency Medicine | Admitting: Emergency Medicine

## 2016-05-27 DIAGNOSIS — G8929 Other chronic pain: Secondary | ICD-10-CM

## 2016-05-27 DIAGNOSIS — Z791 Long term (current) use of non-steroidal anti-inflammatories (NSAID): Secondary | ICD-10-CM | POA: Insufficient documentation

## 2016-05-27 DIAGNOSIS — Z7982 Long term (current) use of aspirin: Secondary | ICD-10-CM | POA: Insufficient documentation

## 2016-05-27 DIAGNOSIS — M5416 Radiculopathy, lumbar region: Secondary | ICD-10-CM | POA: Insufficient documentation

## 2016-05-27 DIAGNOSIS — F1490 Cocaine use, unspecified, uncomplicated: Secondary | ICD-10-CM | POA: Insufficient documentation

## 2016-05-27 DIAGNOSIS — M5442 Lumbago with sciatica, left side: Secondary | ICD-10-CM

## 2016-05-27 MED ORDER — MELOXICAM 7.5 MG PO TABS: 7.5000 mg | ORAL_TABLET | Freq: Every day | ORAL | 2 refills | Status: AC

## 2016-05-27 MED ORDER — KETOROLAC TROMETHAMINE 60 MG/2ML IM SOLN
60.0000 mg | Freq: Once | INTRAMUSCULAR | Status: AC
  Administered 2016-05-27: 60 mg via INTRAMUSCULAR
  Filled 2016-05-27: qty 2

## 2016-05-27 NOTE — ED Provider Notes (Addendum)
[facility] Emergency Department Provider Note ____________________________________________  Time seen: Approximately 9:55 AM  I have reviewed the triage vital signs and the nursing notes.   HISTORY  Chief Complaint Back Pain    HPI Randy Jordan is a 55 y.o. male presenting to the emergency department with "low back pain" with radicular symptoms. Patient describes his back pain as aching and 8/10 in intensity. States that he is trying to achieve "disability status". In addition, he intends to establish care with a pain management organization. Percocet is the only medication that will reliably relieve his pain. He denies claudication, bowel or bladder incontinence, or groin numbness. He states that prednisone and other NSAID drugs are ineffective for him. He is resistant to trying lidocaine patches. Patient states, "the only medicine I want, is Percocet.". Denies fever, trauma or recent surgeries.  Past Medical History:  Diagnosis Date  . Bulging lumbar disc   . Chronic back pain   . Cocaine abuse   . Lumbar vertebral fracture (HCC)   . Syncope 03/2016   a. felt to be 2/2 dehyration    Patient Active Problem List   Diagnosis Date Noted  . Cocaine abuse   . Syncope 03/21/2016    Past Surgical History:  Procedure Laterality Date  . ARM WOUND REPAIR / CLOSURE Right    arm fracture repair  . CHOLECYSTECTOMY    . FRACTURE SURGERY     Right arm    Prior to Admission medications   Medication Sig Start Date End Date Taking? Authorizing Provider  aspirin EC 81 MG EC tablet Take 1 tablet (81 mg total) by mouth daily. 03/23/16   Milagros LollSrikar Sudini, MD  naproxen (NAPROSYN) 500 MG tablet Take 1 tablet (500 mg total) by mouth 2 (two) times daily with a meal. Patient not taking: Reported on 03/21/2016 03/11/16   Joni Reiningonald K Smith, PA-C    Allergies Tramadol and Benadryl [diphenhydramine hcl (sleep)]  Family History  Problem Relation Age of Onset  . Heart  disease Mother   . CAD Father     Social History Social History  Substance Use Topics  . Smoking status: Never Smoker  . Smokeless tobacco: Never Used  . Alcohol use Yes     Comment: occaisonly    Review of Systems Constitutional: No recent illness. Cardiovascular: Denies chest pain or palpitations. Respiratory: Denies shortness of breath. Musculoskeletal: Pain in lumbar region with radicular symptoms  Skin: Negative for rash, wound, lesion. Neurological: Negative for focal weakness or numbness.  ____________________________________________   PHYSICAL EXAM:  VITAL SIGNS: ED Triage Vitals  Enc Vitals Group     BP 05/27/16 0853 (!) 148/86     Pulse Rate 05/27/16 0853 80     Resp 05/27/16 0853 18     Temp 05/27/16 0853 98.6 F (37 C)     Temp Source 05/27/16 0853 Oral     SpO2 05/27/16 0853 96 %     Weight 05/27/16 0854 250 lb (113.4 kg)     Height 05/27/16 0854 6' (1.829 m)     Head Circumference --      Peak Flow --      Pain Score 05/27/16 0856 10     Pain Loc --      Pain Edu? --      Excl. in GC? --     Constitutional: Alert and oriented. Well appearing and in no acute distress. Eyes: Conjunctivae are normal. EOMI. Head: Atraumatic. Neck: No stridor.  Respiratory: Normal  respiratory effort.   Musculoskeletal: Patient has 5/5 strength in the upper and lower extremities bilaterally. Reflexes are 2+ and symmetric in the upper and lower extremities. Patient has positive straight leg raise test on the left. Patient's low back pain is worsened with extension at the spine. Patient has radicular symptoms along the S1, S2 dermatomes (left lower extremity). Palpable radial, ulnar and dorsalis pedis pulses bilaterally.  Neurologic: Normal speech and language. No gross focal neurologic deficits are appreciated. Cranial nerves: 2-12 normal as tested. Cerebellar: Finger-nose-finger WNL, heel to shin WNL. Sensorimotor: No pronator drift, clonus, sensory loss or abnormal  reflexes. Vision: No visual field deficts noted to confrontation.  Speech: No dysarthria or expressive aphasia.  Skin:  Skin is warm, dry and intact. Atraumatic. Psychiatric: Mood and affect are normal. Speech and behavior are normal.  ____________________________________________   LABS (all labs ordered are listed, but only abnormal results are displayed)  Labs Reviewed - No data to display ____________________________________________     PROCEDURES Toradol   ____________________________________________   INITIAL IMPRESSION / ASSESSMENT AND PLAN / ED COURSE  Clinical Course     Pertinent labs & imaging results that were available during my care of the patient were reviewed by me and considered in my medical decision making (see chart for details).  Assessment and plan: Low back pain: Patient has low back pain with radiculopathy along the S1-S2 dermatomes. Patient denies bowel or bladder incontinence or groin numbness. I do not suspect cauda equina syndrome. Patient is resistant to trying lidocaine patches or a course of prednisone. He states that he does not want prescriptions for an anti-inflammatory drug. Patient received an injection of Toradol in the emergency department. I did not prescribe Percocet at this time. A referral was made to the orthopedist on-call, Dr. Rosita KeaMenz. All patient questions were answered.  ----------------------------------------- 10:31 AM on 05/27/2016 -----------------------------------------  Patient approached nurse regarding obtaining a prescription for an anti-inflammatory drug. Patient was discharged on Mobic.   ____________________________________________   FINAL CLINICAL IMPRESSION(S) / ED DIAGNOSES  Final diagnoses:  None       Orvil FeilJaclyn M Woods, PA-C 05/27/16 1026    Jene Everyobert Kinner, MD 05/27/16 [address]1027    Jaclyn M ArbyrdWoods, PA-C 05/27/16 1032    Jene Everyobert Kinner, MD 05/27/16 1139

## 2016-05-27 NOTE — ED Triage Notes (Signed)
Pt c/o lower back pain that radiates into the left leg, states he has flare ups about 3 times/year..Marland Kitchen

## 2017-10-11 ENCOUNTER — Other Ambulatory Visit: Payer: Self-pay

## 2017-10-11 ENCOUNTER — Emergency Department: Payer: Self-pay

## 2017-10-11 ENCOUNTER — Emergency Department
Admission: EM | Admit: 2017-10-11 | Discharge: 2017-10-11 | Disposition: A | Discharge status: Home / Self Care | Payer: Self-pay | Attending: Emergency Medicine | Admitting: Emergency Medicine

## 2017-10-11 DIAGNOSIS — Z7982 Long term (current) use of aspirin: Secondary | ICD-10-CM | POA: Insufficient documentation

## 2017-10-11 DIAGNOSIS — R1084 Generalized abdominal pain: Secondary | ICD-10-CM | POA: Insufficient documentation

## 2017-10-11 DIAGNOSIS — R109 Unspecified abdominal pain: Secondary | ICD-10-CM

## 2017-10-11 LAB — URINALYSIS, COMPLETE (UACMP) WITH MICROSCOPIC
BACTERIA UA: NONE SEEN
Bilirubin Urine: NEGATIVE
Glucose, UA: >=500 mg/dL — AB
Hgb urine dipstick: NEGATIVE
Ketones, ur: 5 mg/dL — AB
Leukocytes, UA: NEGATIVE
Nitrite: NEGATIVE
Protein, ur: NEGATIVE mg/dL
RBC / HPF: NONE SEEN RBC/hpf (ref 0–5)
SPECIFIC GRAVITY, URINE: 1.026 (ref 1.005–1.030)
Squamous Epithelial / LPF: NONE SEEN
pH: 6 (ref 5.0–8.0)

## 2017-10-11 LAB — CBC
HEMATOCRIT: 42.8 % (ref 40.0–52.0)
Hemoglobin: 15 g/dL (ref 13.0–18.0)
MCH: 33.1 pg (ref 26.0–34.0)
MCHC: 35 g/dL (ref 32.0–36.0)
MCV: 94.5 fL (ref 80.0–100.0)
PLATELETS: 120 10*3/uL — AB (ref 150–440)
RBC: 4.53 MIL/uL (ref 4.40–5.90)
RDW: 12.9 % (ref 11.5–14.5)
WBC: 5.7 10*3/uL (ref 3.8–10.6)

## 2017-10-11 LAB — BASIC METABOLIC PANEL
Anion gap: 6 (ref 5–15)
BUN: 19 mg/dL (ref 6–20)
CHLORIDE: 105 mmol/L (ref 101–111)
CO2: 23 mmol/L (ref 22–32)
CREATININE: 1.16 mg/dL (ref 0.61–1.24)
Calcium: 8.8 mg/dL — ABNORMAL LOW (ref 8.9–10.3)
Glucose, Bld: 250 mg/dL — ABNORMAL HIGH (ref 65–99)
POTASSIUM: 4.1 mmol/L (ref 3.5–5.1)
SODIUM: 134 mmol/L — AB (ref 135–145)

## 2017-10-11 MED ORDER — CYCLOBENZAPRINE HCL 10 MG PO TABS: 10.0000 mg | ORAL_TABLET | Freq: Three times a day (TID) | ORAL | 0 refills | Status: DC | PRN

## 2017-10-11 MED ORDER — ONDANSETRON HCL 4 MG/2ML IJ SOLN
4.0000 mg | Freq: Once | INTRAMUSCULAR | Status: AC
  Administered 2017-10-11: 4 mg via INTRAVENOUS
  Filled 2017-10-11: qty 2

## 2017-10-11 MED ORDER — MORPHINE SULFATE (PF) 2 MG/ML IV SOLN
2.0000 mg | Freq: Once | INTRAVENOUS | Status: AC
  Administered 2017-10-11: 2 mg via INTRAVENOUS
  Filled 2017-10-11: qty 1

## 2017-10-11 MED ORDER — SODIUM CHLORIDE 0.9 % IV BOLUS
1000.0000 mL | Freq: Once | INTRAVENOUS | Status: AC
  Administered 2017-10-11: 1000 mL via INTRAVENOUS

## 2017-10-11 NOTE — ED Triage Notes (Signed)
First nurse:  Right flank pain for 1 month.  Worried it is a kidney stone.

## 2017-10-11 NOTE — ED Triage Notes (Addendum)
Pt was here visiting with his daughter, talked to daughters doctor who told him to check in. Pt states R sided flank pain x 1 month. Denies hematuria. States few days ago saw that he passed 2 small stones possibly. Denies urinary symptoms. Alert, oriented, ambulatory. No distress noted.

## 2017-10-11 NOTE — ED Provider Notes (Signed)
[facility] Emergency Department Provider Note    First MD Initiated Contact with Patient 10/11/17 1318     (approximate)  I have reviewed the triage vital signs and the nursing notes.   HISTORY  Chief Complaint Flank Pain   HPI Randy Jordan is a 57 y.o. male with below chronic medical conditions presents to the emergency department withintermittent right flank pain times one month. Patient denies any hematuria. Patient states that he believes that he may have passed 2 small stones. Patient denies any dysuria no nausea vomiting or fever.patient states his current pain score is 9 out of 10   Past Medical History:  Diagnosis Date  . Bulging lumbar disc   . Chronic back pain   . Cocaine abuse (HCC)   . Lumbar vertebral fracture (HCC)   . Syncope 03/2016   a. felt to be 2/2 dehyration    Patient Active Problem List   Diagnosis Date Noted  . Cocaine abuse (HCC)   . Syncope 03/21/2016    Past Surgical History:  Procedure Laterality Date  . ARM WOUND REPAIR / CLOSURE Right    arm fracture repair  . CHOLECYSTECTOMY    . FRACTURE SURGERY     Right arm    Prior to Admission medications   Medication Sig Start Date End Date Taking? Authorizing Provider  aspirin EC 81 MG EC tablet Take 1 tablet (81 mg total) by mouth daily. 03/23/16   Milagros Loll, MD  cyclobenzaprine (FLEXERIL) 10 MG tablet Take 1 tablet (10 mg total) by mouth 3 (three) times daily as needed. 10/11/17   Darci Current, MD  naproxen (NAPROSYN) 500 MG tablet Take 1 tablet (500 mg total) by mouth 2 (two) times daily with a meal. Patient not taking: Reported on 03/21/2016 03/11/16   Joni Reining, PA-C    Allergies Toradol [ketorolac tromethamine]; Tramadol; and Benadryl [diphenhydramine hcl (sleep)]  Family History  Problem Relation Age of Onset  . Heart disease Mother   . CAD Father     Social History Social History   Tobacco Use  . Smoking status: Never Smoker    . Smokeless tobacco: Never Used  Substance Use Topics  . Alcohol use: Yes    Comment: occaisonly  . Drug use: Yes    Types: Cocaine    Comment: 03/2016: Pt denies but UDS + cocaine.    Review of Systems Constitutional: No fever/chills Eyes: No visual changes. ENT: No sore throat. Cardiovascular: Denies chest pain. Respiratory: Denies shortness of breath. Gastrointestinal: No abdominal pain.  No nausea, no vomiting.  No diarrhea.  No constipation. Genitourinary: Negative for dysuria. Musculoskeletal: Negative for neck pain.  positive for back pain. Integumentary: Negative for rash. Neurological: Negative for headaches, focal weakness or numbness.   ____________________________________________   PHYSICAL EXAM:  VITAL SIGNS: ED Triage Vitals  Enc Vitals Group     BP 10/11/17 1251 (!) 157/93     Pulse Rate 10/11/17 1251 71     Resp 10/11/17 1251 16     Temp 10/11/17 1251 97.6 F (36.4 C)     Temp Source 10/11/17 1251 Oral     SpO2 10/11/17 1251 97 %     Weight 10/11/17 1252 117.9 kg (260 lb)     Height 10/11/17 1252 1.854 m (6\' 1" )     Head Circumference --      Peak Flow --      Pain Score 10/11/17 1251 8     Pain  Loc --      Pain Edu? --      Excl. in GC? --     Constitutional: Alert and oriented. Well appearing and in no acute distress. Eyes: Conjunctivae are normal.  Head: Atraumatic. Mouth/Throat: Mucous membranes are moist.  Oropharynx non-erythematous. Neck: No stridor.  Cardiovascular: Normal rate, regular rhythm. Good peripheral circulation. Grossly normal heart sounds. Respiratory: Normal respiratory effort.  No retractions. Lungs CTAB. Gastrointestinal: Soft and nontender. No distention.  Musculoskeletal: No lower extremity tenderness nor edema. No gross deformities of extremities. Neurologic:  Normal speech and language. No gross focal neurologic deficits are appreciated.  Skin:  Skin is warm, dry and intact. No rash noted. Psychiatric: Mood and  affect are normal. Speech and behavior are normal.  ____________________________________________   LABS (all labs ordered are listed, but only abnormal results are displayed)  Labs Reviewed  URINALYSIS, COMPLETE (UACMP) WITH MICROSCOPIC - Abnormal; Notable for the following components:      Result Value   Color, Urine YELLOW (*)    APPearance CLEAR (*)    Glucose, UA >=500 (*)    Ketones, ur 5 (*)    All other components within normal limits  BASIC METABOLIC PANEL - Abnormal; Notable for the following components:   Sodium 134 (*)    Glucose, Bld 250 (*)    Calcium 8.8 (*)    All other components within normal limits  CBC - Abnormal; Notable for the following components:   Platelets 120 (*)    All other components within normal limits     RADIOLOGY I, Washburn N Esme Durkin, personally viewed and evaluated these images (plain radiographs) as part of my medical decision making, as well as reviewing the written report by the radiologist.  ED MD interpretation:  no acute findings to explain the patient's right-sided flank pain per radiologist.  Official radiology report(s): Ct Renal Stone Study  Result Date: 10/11/2017 CLINICAL DATA:  57 year old male with a history of right-sided flank pain EXAM: CT ABDOMEN AND PELVIS WITHOUT CONTRAST TECHNIQUE: Multidetector CT imaging of the abdomen and pelvis was performed following the standard protocol without IV contrast. COMPARISON:  08/13/2010, 06/21/2010 FINDINGS: Lower chest: No acute abnormality. Hepatobiliary: Diffusely decreased attenuation of liver parenchyma compatible with steatosis. Cranial caudal span of the right liver measures 18-19 cm. Surgical changes of cholecystectomy Pancreas: Unremarkable pancreas Spleen: Unremarkable spleen Adrenals/Urinary Tract: Unremarkable adrenal glands. Bilateral kidneys with no hydronephrosis or nephrolithiasis. No perinephric stranding. Unremarkable course the bilateral ureters. Unremarkable urinary  bladder. Stomach/Bowel: Unremarkable stomach. Unremarkable small bowel. No abnormal distention. No transition point. Normal appendix. Moderate stool burden. Colonic diverticular disease without evidence of associated inflammatory changes. Vascular/Lymphatic: No significant calcified vascular disease. No adenopathy. Reproductive: Transverse diameter of the prostate measures 4.2 cm. Other: Small fat containing umbilical hernia. Small fat containing left inguinal hernia. Musculoskeletal: No acute displaced fracture. Unchanged T9 compression fracture from the prior plain film of 2015. No bony canal narrowing. Mild degenerative changes of the spine and hips. IMPRESSION: No acute CT finding to account for right-sided flank pain. Liver steatosis. Diverticular disease without evidence of acute diverticulitis. Electronically Signed   By: Gilmer MorJaime  Wagner D.O.   On: 10/11/2017 14:59      Procedures   ____________________________________________   INITIAL IMPRESSION / ASSESSMENT AND PLAN / ED COURSE  As part of my medical decision making, I reviewed the following data within the electronic MEDICAL RECORD NUMBER   57 year old male presenting with above stated history and physical exam secondary to right  flank pain. Considered possibly of a ureteral lithiasis and a such CT renal protocol was performed which revealed no acute findings. Patient does have a long-standing history of back pain and a such patient's current discomfort may be an exacerbation of his existing chronic back pain. Patient was given IV morphine in the emergency department will be prescribed Flexeril for home. ____________________________________________  FINAL CLINICAL IMPRESSION(S) / ED DIAGNOSES  Final diagnoses:  Right flank pain     MEDICATIONS GIVEN DURING THIS VISIT:  Medications  morphine 2 MG/ML injection 2 mg (2 mg Intravenous Given 10/11/17 1358)  ondansetron (ZOFRAN) injection 4 mg (4 mg Intravenous Given 10/11/17 1354)  sodium  chloride 0.9 % bolus 1,000 mL (0 mLs Intravenous Stopped 10/11/17 1527)     ED Discharge Orders        Ordered    cyclobenzaprine (FLEXERIL) 10 MG tablet  3 times daily PRN     10/11/17 1457       Note:  This document was prepared using Dragon voice recognition software and may include unintentional dictation errors.    Darci Current, MD 10/12/17 [phone]

## 2017-12-09 ENCOUNTER — Other Ambulatory Visit: Payer: Self-pay

## 2017-12-09 ENCOUNTER — Emergency Department
Admission: EM | Admit: 2017-12-09 | Discharge: 2017-12-09 | Disposition: A | Discharge status: Home / Self Care | Payer: Self-pay | Attending: Emergency Medicine | Admitting: Emergency Medicine

## 2017-12-09 ENCOUNTER — Encounter: Payer: Self-pay | Admitting: Emergency Medicine

## 2017-12-09 ENCOUNTER — Emergency Department: Payer: Self-pay

## 2017-12-09 DIAGNOSIS — Z5321 Procedure and treatment not carried out due to patient leaving prior to being seen by health care provider: Secondary | ICD-10-CM | POA: Insufficient documentation

## 2017-12-09 DIAGNOSIS — M79671 Pain in right foot: Secondary | ICD-10-CM | POA: Insufficient documentation

## 2017-12-09 NOTE — ED Triage Notes (Signed)
Pt presents to ED with pain to 5th digit on his right foot. Pt states 3-4 hours ago he hit his toe twice on large antique chest. Affected toe is purple in color with slight swelling noted.

## 2018-04-12 ENCOUNTER — Other Ambulatory Visit: Payer: Self-pay

## 2018-04-12 ENCOUNTER — Emergency Department: Payer: Self-pay

## 2018-04-12 ENCOUNTER — Emergency Department
Admission: EM | Admit: 2018-04-12 | Discharge: 2018-04-12 | Disposition: A | Discharge status: Home / Self Care | Payer: Self-pay | Attending: Emergency Medicine | Admitting: Emergency Medicine

## 2018-04-12 DIAGNOSIS — M25512 Pain in left shoulder: Secondary | ICD-10-CM | POA: Insufficient documentation

## 2018-04-12 DIAGNOSIS — M25552 Pain in left hip: Secondary | ICD-10-CM | POA: Insufficient documentation

## 2018-04-12 DIAGNOSIS — Z9049 Acquired absence of other specified parts of digestive tract: Secondary | ICD-10-CM | POA: Insufficient documentation

## 2018-04-12 DIAGNOSIS — F1410 Cocaine abuse, uncomplicated: Secondary | ICD-10-CM | POA: Insufficient documentation

## 2018-04-12 DIAGNOSIS — Z7982 Long term (current) use of aspirin: Secondary | ICD-10-CM | POA: Insufficient documentation

## 2018-04-12 DIAGNOSIS — M79609 Pain in unspecified limb: Secondary | ICD-10-CM

## 2018-04-12 DIAGNOSIS — W010XXA Fall on same level from slipping, tripping and stumbling without subsequent striking against object, initial encounter: Secondary | ICD-10-CM | POA: Insufficient documentation

## 2018-04-12 DIAGNOSIS — R0789 Other chest pain: Secondary | ICD-10-CM | POA: Insufficient documentation

## 2018-04-12 DIAGNOSIS — R0781 Pleurodynia: Secondary | ICD-10-CM

## 2018-04-12 MED ORDER — HYDROCODONE-ACETAMINOPHEN 5-325 MG PO TABS
1.0000 | ORAL_TABLET | Freq: Once | ORAL | Status: AC
  Administered 2018-04-12: 1 via ORAL
  Filled 2018-04-12: qty 1

## 2018-04-12 MED ORDER — HYDROCODONE-ACETAMINOPHEN 5-325 MG PO TABS: 1.0000 | ORAL_TABLET | Freq: Four times a day (QID) | ORAL | 0 refills | Status: AC | PRN

## 2018-04-12 MED ORDER — CYCLOBENZAPRINE HCL 10 MG PO TABS
10.0000 mg | ORAL_TABLET | Freq: Once | ORAL | Status: AC
  Administered 2018-04-12: 10 mg via ORAL
  Filled 2018-04-12: qty 1

## 2018-04-12 MED ORDER — CYCLOBENZAPRINE HCL 10 MG PO TABS: 10.0000 mg | ORAL_TABLET | Freq: Three times a day (TID) | ORAL | 0 refills | Status: AC | PRN

## 2018-04-12 MED ORDER — HYDROCODONE-ACETAMINOPHEN 5-325 MG PO TABS: 1.0000 | ORAL_TABLET | Freq: Four times a day (QID) | ORAL | 0 refills | Status: DC | PRN

## 2018-04-12 NOTE — ED Triage Notes (Signed)
Patient reports yesterday slipped and fell on water.  Complains of left shoulder, left rib and left hip pain.

## 2018-04-12 NOTE — ED Provider Notes (Signed)
[facility] Emergency Department Provider Note ____________________________________________  Time seen: Approximately 10:07 PM  I have reviewed the triage vital signs and the nursing notes.   HISTORY  Chief Complaint Fall    HPI Randy Jordan is a 57 y.o. male who presents to the emergency department for evaluation and treatment of pain post fall yesterday. He states that he slipped while in [facility] and landed on his left side. He now has left shoulder, rib, and hip pain. He denies shortness of breath, striking his head, or other symptoms of concern. No relief with tylenol.  Past Medical History:  Diagnosis Date  . Bulging lumbar disc   . Chronic back pain   . Cocaine abuse (HCC)   . Lumbar vertebral fracture (HCC)   . Syncope 03/2016   a. felt to be 2/2 dehyration    Patient Active Problem List   Diagnosis Date Noted  . Cocaine abuse (HCC)   . Syncope 03/21/2016    Past Surgical History:  Procedure Laterality Date  . ARM WOUND REPAIR / CLOSURE Right    arm fracture repair  . CHOLECYSTECTOMY    . FRACTURE SURGERY     Right arm    Prior to Admission medications   Medication Sig Start Date End Date Taking? Authorizing Provider  aspirin EC 81 MG EC tablet Take 1 tablet (81 mg total) by mouth daily. 03/23/16   Milagros Loll, MD  cyclobenzaprine (FLEXERIL) 10 MG tablet Take 1 tablet (10 mg total) by mouth 3 (three) times daily as needed for muscle spasms. 04/12/18   Euclide Granito, Rulon Eisenmenger B, FNP  HYDROcodone-acetaminophen (NORCO/VICODIN) 5-325 MG tablet Take 1 tablet by mouth every 6 (six) hours as needed for up to 3 days for severe pain. 04/12/18 04/15/18  Laverta Harnisch, Kasandra Knudsen, FNP  naproxen (NAPROSYN) 500 MG tablet Take 1 tablet (500 mg total) by mouth 2 (two) times daily with a meal. Patient not taking: Reported on 03/21/2016 03/11/16   Joni Reining, PA-C    Allergies Toradol [ketorolac tromethamine]; Tramadol; Benadryl [diphenhydramine hcl  (sleep)]; and Gabapentin  Family History  Problem Relation Age of Onset  . Heart disease Mother   . CAD Father     Social History Social History   Tobacco Use  . Smoking status: Never Smoker  . Smokeless tobacco: Never Used  Substance Use Topics  . Alcohol use: Yes    Comment: occaisonly  . Drug use: Yes    Types: Cocaine    Comment: 03/2016: Pt denies but UDS + cocaine.    Review of Systems Constitutional: Negative for fever. Cardiovascular: Negative for chest pain. Respiratory: Negative for shortness of breath. Musculoskeletal: Positive for left shoulder, left lateral thorax and left hip pain. Skin: Negative for open wound over exposed skin.  Neurological: Negative for decrease in sensation  ____________________________________________   PHYSICAL EXAM:  VITAL SIGNS: ED Triage Vitals [04/12/18 2123]  Enc Vitals Group     BP (!) 146/93     Pulse Rate 93     Resp 17     Temp (!) 97.4 F (36.3 C)     Temp Source Oral     SpO2 96 %     Weight 250 lb (113.4 kg)     Height 6' (1.829 m)     Head Circumference      Peak Flow      Pain Score 9     Pain Loc      Pain Edu?  Excl. in GC?     Constitutional: Alert and oriented. Well appearing and in no acute distress. Eyes: Conjunctivae are clear without discharge or drainage Head: Atraumatic Neck: Supple. No focal midline tenderness. Respiratory: No cough. Respirations are even and unlabored. Musculoskeletal: Tenderness in the anterior shoulder, mid deltoid area, left posterior lateral thorax, and left hip.  Neurologic: Awake, alert, oriented x4. Skin: No contusions or abrasions noted over areas of expressed pain.   Psychiatric: Affect and behavior are appropriate.  ____________________________________________   LABS (all labs ordered are listed, but only abnormal results are displayed)  Labs Reviewed - No data to display ____________________________________________  RADIOLOGY  Image of the left  shoulder reveals some cortical irregularity of the left lateral second and third ribs on the AP view only.  Image of the left shoulder itself is negative for acute findings.  Dedicated image of the left rib and chest is negative for acute findings. ____________________________________________   PROCEDURES  Procedures  ____________________________________________   INITIAL IMPRESSION / ASSESSMENT AND PLAN / ED COURSE  Randy Jordan is a 57 y.o. who presents to the emergency department for treatment and evaluation after a mechanical, non-syncopal fall yesterday while in a [place].  Patient states that he has taken some Tylenol at home without any relief.   Questionable cortical irregularity of the left lateral second and third ribs is inconsistent with patient's complaint of left posterior lateral rib pain.  Patient does state that he has had a rib fracture in the past but is unable to recall which ribs were affected.  Patient was given Norco while waiting image results and states that this has significantly improved his pain.  He was observed ambulating in the hallway with a steady unassisted gait.  Patient instructed to follow-up with primary care for symptoms not improving over the week.  He states he has no PCP and was given information for [facility]. He was also instructed to return to the emergency department for symptoms that change or worsen if unable schedule an appointment.  Medications  cyclobenzaprine (FLEXERIL) tablet 10 mg (has no administration in time range)  HYDROcodone-acetaminophen (NORCO/VICODIN) 5-325 MG per tablet 1 tablet (1 tablet Oral Given 04/12/18 2210)    Pertinent labs & imaging results that were available during my care of the patient were reviewed by me and considered in my medical decision making (see chart for details).  _________________________________________   FINAL CLINICAL IMPRESSION(S) / ED DIAGNOSES  Final  diagnoses:  Acute pain of extremity  Acute pain of left shoulder  Rib pain on left side  Acute hip pain, left    ED Discharge Orders         Ordered    cyclobenzaprine (FLEXERIL) 10 MG tablet  3 times daily PRN     04/12/18 2315    HYDROcodone-acetaminophen (NORCO/VICODIN) 5-325 MG tablet  Every 6 hours PRN,   Status:  Discontinued     04/12/18 2315    HYDROcodone-acetaminophen (NORCO/VICODIN) 5-325 MG tablet  Every 6 hours PRN     04/12/18 2316           If controlled substance prescribed during this visit, 12 month history viewed on the [facility] prior to issuing an initial prescription for Schedule II or III opiod.    Chinita Pester, FNP 04/12/18 1610    [name], MD 04/17/18 [phone]

## 2018-04-12 NOTE — Discharge Instructions (Signed)
Please follow-up with the primary care provider of your choice for symptoms that are not improving over the week.  Take medications only as prescribed.  Return to the emergency department for symptoms of change or worsen if you are unable to schedule an appointment.

## 2018-04-12 NOTE — ED Notes (Signed)
Pt  Fell on water  While walking  C/o pain l  Shoulder  l ribs and  Hip  Pain on bearing    Did not black out  He is awake and alert and oriented

## 2021-01-06 ENCOUNTER — Emergency Department: Payer: Self-pay

## 2021-01-06 ENCOUNTER — Other Ambulatory Visit: Payer: Self-pay

## 2021-01-06 ENCOUNTER — Emergency Department
Admission: EM | Admit: 2021-01-06 | Discharge: 2021-01-06 | Discharge status: Against Medical Advice | Payer: Self-pay | Attending: Emergency Medicine | Admitting: Emergency Medicine

## 2021-01-06 DIAGNOSIS — M25511 Pain in right shoulder: Secondary | ICD-10-CM | POA: Insufficient documentation

## 2021-01-06 DIAGNOSIS — W28XXXA Contact with powered lawn mower, initial encounter: Secondary | ICD-10-CM | POA: Insufficient documentation

## 2021-01-06 DIAGNOSIS — Y93H2 Activity, gardening and landscaping: Secondary | ICD-10-CM | POA: Insufficient documentation

## 2021-01-06 DIAGNOSIS — Z7982 Long term (current) use of aspirin: Secondary | ICD-10-CM | POA: Insufficient documentation

## 2021-01-06 DIAGNOSIS — Z20822 Contact with and (suspected) exposure to covid-19: Secondary | ICD-10-CM | POA: Insufficient documentation

## 2021-01-06 DIAGNOSIS — I5021 Acute systolic (congestive) heart failure: Secondary | ICD-10-CM | POA: Insufficient documentation

## 2021-01-06 DIAGNOSIS — Y92017 Garden or yard in single-family (private) house as the place of occurrence of the external cause: Secondary | ICD-10-CM | POA: Insufficient documentation

## 2021-01-06 DIAGNOSIS — R55 Syncope and collapse: Secondary | ICD-10-CM | POA: Insufficient documentation

## 2021-01-06 LAB — CBC WITH DIFFERENTIAL/PLATELET
Abs Immature Granulocytes: 0.04 10*3/uL (ref 0.00–0.07)
Basophils Absolute: 0 10*3/uL (ref 0.0–0.1)
Basophils Relative: 0 %
Eosinophils Absolute: 0.1 10*3/uL (ref 0.0–0.5)
Eosinophils Relative: 2 %
HCT: 39.3 % (ref 39.0–52.0)
Hemoglobin: 13.5 g/dL (ref 13.0–17.0)
Immature Granulocytes: 1 %
Lymphocytes Relative: 29 %
Lymphs Abs: 2.1 10*3/uL (ref 0.7–4.0)
MCH: 31.8 pg (ref 26.0–34.0)
MCHC: 34.4 g/dL (ref 30.0–36.0)
MCV: 92.7 fL (ref 80.0–100.0)
Monocytes Absolute: 0.5 10*3/uL (ref 0.1–1.0)
Monocytes Relative: 7 %
Neutro Abs: 4.3 10*3/uL (ref 1.7–7.7)
Neutrophils Relative %: 61 %
Platelets: 137 10*3/uL — ABNORMAL LOW (ref 150–400)
RBC: 4.24 MIL/uL (ref 4.22–5.81)
RDW: 12.2 % (ref 11.5–15.5)
WBC: 7.1 10*3/uL (ref 4.0–10.5)
nRBC: 0 % (ref 0.0–0.2)

## 2021-01-06 LAB — URINALYSIS, COMPLETE (UACMP) WITH MICROSCOPIC
Bacteria, UA: NONE SEEN
Bilirubin Urine: NEGATIVE
Glucose, UA: >=500 mg/dL — AB
Hgb urine dipstick: NEGATIVE
Ketones, ur: NEGATIVE mg/dL
Leukocytes,Ua: NEGATIVE
Nitrite: NEGATIVE
Protein, ur: NEGATIVE mg/dL
Specific Gravity, Urine: 1.029 (ref 1.005–1.030)
Squamous Epithelial / HPF: NONE SEEN (ref 0–5)
pH: 5 (ref 5.0–8.0)

## 2021-01-06 LAB — COMPREHENSIVE METABOLIC PANEL
ALT: 23 U/L (ref 0–44)
AST: 25 U/L (ref 15–41)
Albumin: 3.9 g/dL (ref 3.5–5.0)
Alkaline Phosphatase: 80 U/L (ref 38–126)
Anion gap: 6 (ref 5–15)
BUN: 18 mg/dL (ref 6–20)
CO2: 26 mmol/L (ref 22–32)
Calcium: 9 mg/dL (ref 8.9–10.3)
Chloride: 102 mmol/L (ref 98–111)
Creatinine, Ser: 1.07 mg/dL (ref 0.61–1.24)
GFR, Estimated: >60 mL/min (ref >60)
Glucose, Bld: 465 mg/dL — ABNORMAL HIGH (ref 70–99)
Potassium: 5.2 mmol/L — ABNORMAL HIGH (ref 3.5–5.1)
Sodium: 134 mmol/L — ABNORMAL LOW (ref 135–145)
Total Bilirubin: 1.3 mg/dL — ABNORMAL HIGH (ref 0.3–1.2)
Total Protein: 7 g/dL (ref 6.5–8.1)

## 2021-01-06 LAB — RESP PANEL BY RT-PCR (FLU A&B, COVID) ARPGX2
Influenza A by PCR: NEGATIVE
Influenza B by PCR: NEGATIVE
SARS Coronavirus 2 by RT PCR: NEGATIVE

## 2021-01-06 LAB — BRAIN NATRIURETIC PEPTIDE: B Natriuretic Peptide: 31.8 pg/mL (ref 0.0–100.0)

## 2021-01-06 LAB — TROPONIN I (HIGH SENSITIVITY)
Troponin I (High Sensitivity): 8 ng/L (ref <18)
Troponin I (High Sensitivity): 10 ng/L (ref <18)

## 2021-01-06 MED ORDER — ONDANSETRON HCL 4 MG/2ML IJ SOLN
4.0000 mg | Freq: Once | INTRAMUSCULAR | Status: AC
  Administered 2021-01-06: 4 mg via INTRAVENOUS
  Filled 2021-01-06: qty 2

## 2021-01-06 MED ORDER — MORPHINE SULFATE (PF) 4 MG/ML IV SOLN
4.0000 mg | Freq: Once | INTRAVENOUS | Status: AC
  Administered 2021-01-06: 4 mg via INTRAVENOUS
  Filled 2021-01-06: qty 1

## 2021-01-06 MED ORDER — SODIUM CHLORIDE 0.9 % IV BOLUS
1000.0000 mL | Freq: Once | INTRAVENOUS | Status: AC
  Administered 2021-01-06: 1000 mL via INTRAVENOUS

## 2021-01-06 NOTE — ED Notes (Signed)
Pt helped to stand to use urinal, pt denies dizziness.

## 2021-01-06 NOTE — ED Provider Notes (Addendum)
[facility] Emergency Department Provider Note   ____________________________________________   Event Date/Time   First MD Initiated Contact with Patient 01/06/21 1726     (approximate)  I have reviewed the triage vital signs and the nursing notes.   HISTORY  Chief Complaint Loss of Consciousness    HPI Randy Jordan is a 60 y.o. male who was mowing his yard with a push mower this morning was very hot and humid.  He then went inside was eating a salad passed out and did not wake up until the ambulance had him in the back of the ambulance.  He complains of a lot of pain in his shoulder thinks he might of hit his shoulder when he fell.  Is tender to palpation and palpation reproduces the pain exactly.  Patient had a history of syncope 5 years ago.  He is diabetic and does not take his metformin regularly because he cannot remember it.  Reports he has some numbness and tingling in his feet chronically intermittently.  He is not having any other complaints currently.  His glucose is above 400.        Past Medical History:  Diagnosis Date   Bulging lumbar disc    Chronic back pain    Cocaine abuse (HCC)    Lumbar vertebral fracture (HCC)    Syncope 03/2016   a. felt to be 2/2 dehyration    Patient Active Problem List   Diagnosis Date Noted   Cocaine abuse (HCC)    Syncope 03/21/2016    Past Surgical History:  Procedure Laterality Date   ARM WOUND REPAIR / CLOSURE Right    arm fracture repair   CHOLECYSTECTOMY     FRACTURE SURGERY     Right arm    Prior to Admission medications   Medication Sig Start Date End Date Taking? Authorizing Provider  aspirin EC 81 MG EC tablet Take 1 tablet (81 mg total) by mouth daily. 03/23/16   Milagros Loll, MD  cyclobenzaprine (FLEXERIL) 10 MG tablet Take 1 tablet (10 mg total) by mouth 3 (three) times daily as needed for muscle spasms. 04/12/18   Triplett, Kasandra Knudsen, FNP  naproxen (NAPROSYN) 500 MG tablet  Take 1 tablet (500 mg total) by mouth 2 (two) times daily with a meal. Patient not taking: Reported on 03/21/2016 03/11/16   Joni Reining, PA-C    Allergies Toradol [ketorolac tromethamine], Tramadol, Benadryl [diphenhydramine hcl (sleep)], and Gabapentin  Family History  Problem Relation Age of Onset   Heart disease Mother    CAD Father     Social History Social History   Tobacco Use   Smoking status: Never   Smokeless tobacco: Never  Vaping Use   Vaping Use: Never used  Substance Use Topics   Alcohol use: Yes    Comment: occaisonly   Drug use: Yes    Types: Cocaine    Comment: 03/2016: Pt denies but UDS + cocaine.    Review of Systems  Constitutional: No fever/chills Eyes: No visual changes. ENT: No sore throat. Cardiovascular: Denies chest pain. Respiratory: Denies shortness of breath. Gastrointestinal: No abdominal pain.  No nausea, no vomiting.  No diarrhea.  No constipation. Genitourinary: Negative for dysuria. Musculoskeletal: Negative for back pain. Skin: Negative for rash. Neurological: Negative for headaches, focal weakness   ____________________________________________   PHYSICAL EXAM:  VITAL SIGNS: ED Triage Vitals  Enc Vitals Group     BP 01/06/21 1732 (!) 147/87     Pulse  Rate 01/06/21 1732 73     Resp 01/06/21 1732 18     Temp 01/06/21 1732 98.2 F (36.8 C)     Temp Source 01/06/21 1732 Oral     SpO2 01/06/21 1732 95 %     Weight 01/06/21 1731 219 lb (99.3 kg)     Height 01/06/21 1731 6' (1.829 m)     Head Circumference --      Peak Flow --      Pain Score 01/06/21 1733 8     Pain Loc --      Pain Edu? --      Excl. in GC? --     Constitutional: Alert and oriented.  Looks well but complaining of shoulder pain Eyes: Conjunctivae are normal. PER. EOMI. Head: Atraumatic. Nose: No congestion/rhinnorhea. Mouth/Throat: Mucous membranes are moist.  Oropharynx non-erythematous. Neck: No stridor.  Cardiovascular: Normal rate, regular  rhythm. Grossly normal heart sounds.  Good peripheral circulation. Respiratory: Normal respiratory effort.  No retractions. Lungs CTAB. Gastrointestinal: Soft and nontender. No distention. No abdominal bruits. No CVA tenderness. Musculoskeletal: No lower extremity tenderness nor edema.  Pain in shoulder on the right as described in HPI Neurologic:  Normal speech and language. No gross focal neurologic deficits are appreciated.  Skin:  Skin is warm, dry and intact. No rash noted.  ____________________________________________   LABS (all labs ordered are listed, but only abnormal results are displayed)  Labs Reviewed  COMPREHENSIVE METABOLIC PANEL - Abnormal; Notable for the following components:      Result Value   Sodium 134 (*)    Potassium 5.2 (*)    Glucose, Bld 465 (*)    Total Bilirubin 1.3 (*)    All other components within normal limits  CBC WITH DIFFERENTIAL/PLATELET - Abnormal; Notable for the following components:   Platelets 137 (*)    All other components within normal limits  URINALYSIS, COMPLETE (UACMP) WITH MICROSCOPIC - Abnormal; Notable for the following components:   Color, Urine YELLOW (*)    APPearance CLEAR (*)    Glucose, UA >=500 (*)    All other components within normal limits  RESP PANEL BY RT-PCR (FLU A&B, COVID) ARPGX2  BRAIN NATRIURETIC PEPTIDE  CBG MONITORING, ED  TROPONIN I (HIGH SENSITIVITY)  TROPONIN I (HIGH SENSITIVITY)   ____________________________________________  EKG  EKG read interpreted by me shows normal sinus rhythm rate of 78 normal axis no acute ST-T wave changes there is 1 atrial premature contraction. ____________________________________________  RADIOLOGY Jill Poling, personally viewed and evaluated these images (plain radiographs) as part of my medical decision making, as well as reviewing the written report by the radiologist.  ED MD interpretation: Chest x-ray shows a large heart and some increased interstitial  markings most consistent with congestive heart failure.  Radiology reviewed the film and agrees.  Official radiology report(s): DG Shoulder Right  Result Date: 01/06/2021 CLINICAL DATA:  Recent fall with right shoulder pain, initial encounter EXAM: RIGHT SHOULDER - 2+ VIEW COMPARISON:  None. FINDINGS: Mild degenerative changes of the acromioclavicular joint are seen. Deformity of the midshaft of the right clavicle is noted consistent with prior clavicular fracture and healing. These changes are stable from the prior exam of 2017. Underlying bony thorax shows no acute abnormality. No soft tissue changes are seen. IMPRESSION: Old right clavicular fracture with healing. Mild degenerative changes without acute abnormality. Electronically Signed   By: Alcide Clever M.D.   On: 01/06/2021 18:59   DG Chest Portable 1 View  Result Date: 01/06/2021 CLINICAL DATA:  Recent syncopal episode EXAM: PORTABLE CHEST 1 VIEW COMPARISON:  04/12/2018 FINDINGS: Cardiac shadow is at the upper limits of normal in size. Lungs are well aerated bilaterally. Increased vascular congestion is noted with mild interstitial edema likely related to a degree of CHF. No focal confluent infiltrate is seen. No acute bony abnormality is noted. Old right clavicular fracture is seen. IMPRESSION: Changes of mild CHF. Electronically Signed   By: Alcide Clever M.D.   On: 01/06/2021 19:00    ____________________________________________   PROCEDURES  Procedure(s) performed (including Critical Care):  Procedures   ____________________________________________   INITIAL IMPRESSION / ASSESSMENT AND PLAN / ED COURSE Patient with syncope.  It was apparently sudden onset.  It could have been due to dehydration but the patient now has a chest x-ray with an enlarged heart and some CHF which is a new finding for him.  I have reviewed the old records and do not see any evidence of CHF in his old records.       ----------------------------------------- 9:34 PM on 01/06/2021 ----------------------------------------- Patient was offered admission but became upset because he did not feel we were checking on him often enough.  Patient would not stay in the hospital.  He signed out AMA after being told he could possibly die from leaving.  At the time he was doing so I had a patient who was suicidal with a cut she had put on her arm who was screaming and yelling and a another 57+-year-old lady with altered mental status a sodium of 116 and heart attack and a third patient with a blood pressure in the 50s.       ____________________________________________   FINAL CLINICAL IMPRESSION(S) / ED DIAGNOSES  Final diagnoses:  Syncope and collapse  Acute systolic congestive heart failure [facility])     ED Discharge Orders     None        Note:  This document was prepared using Dragon voice recognition software and may include unintentional dictation errors.    Arnaldo Natal, MD 01/06/21 2135    Arnaldo Natal, MD 01/06/21 2136

## 2021-01-06 NOTE — ED Notes (Signed)
Patient states he will not stay any longer due to "this place being slow and not checking on me enough." Patient was made aware of the risk and benefits. Darnelle Catalan, MD aware. Patient signed AMA.

## 2021-01-06 NOTE — ED Triage Notes (Signed)
Pt to ED via [facility] from home. Per EMS pt found unconscious in the bedroom floor. Pt stating he had been push mowing all day and went home to eat and drink something. Pt c/o right shoulder pain and SOB. Pt denies CP. VSS. CBG 435. Pt stating doesn't take DM medication. Pt A&Ox4 and ambulatory to stretcher.

## 2021-01-06 NOTE — ED Notes (Signed)
Lab called due to lavender top being hemolyzed. Repeat lab drawn

## 2021-01-06 NOTE — ED Notes (Addendum)
Pt given ice chips and pillow.

## 2021-01-08 LAB — CBG MONITORING, ED: Glucose-Capillary: 413 mg/dL — ABNORMAL HIGH (ref 70–99)

## 2024-01-19 ENCOUNTER — Other Ambulatory Visit: Payer: Self-pay

## 2024-01-19 ENCOUNTER — Emergency Department: Payer: Self-pay

## 2024-01-19 ENCOUNTER — Emergency Department
Admission: EM | Admit: 2024-01-19 | Discharge: 2024-01-19 | Disposition: A | Discharge status: Home / Self Care | Payer: Self-pay | Attending: Emergency Medicine | Admitting: Emergency Medicine

## 2024-01-19 ENCOUNTER — Encounter: Payer: Self-pay | Admitting: Intensive Care

## 2024-01-19 DIAGNOSIS — M25461 Effusion, right knee: Secondary | ICD-10-CM | POA: Insufficient documentation

## 2024-01-19 DIAGNOSIS — W01198A Fall on same level from slipping, tripping and stumbling with subsequent striking against other object, initial encounter: Secondary | ICD-10-CM | POA: Insufficient documentation

## 2024-01-19 DIAGNOSIS — S032XXA Dislocation of tooth, initial encounter: Secondary | ICD-10-CM | POA: Insufficient documentation

## 2024-01-19 MED ORDER — OXYCODONE-ACETAMINOPHEN 5-325 MG PO TABS: 1.0000 | ORAL_TABLET | Freq: Four times a day (QID) | ORAL | 0 refills | Status: AC | PRN

## 2024-01-19 MED ORDER — NAPROXEN 500 MG PO TABS: 500.0000 mg | ORAL_TABLET | Freq: Two times a day (BID) | ORAL | 0 refills | Status: AC

## 2024-01-19 MED ORDER — OXYCODONE-ACETAMINOPHEN 5-325 MG PO TABS
1.0000 | ORAL_TABLET | Freq: Once | ORAL | Status: AC
  Administered 2024-01-19: 1 via ORAL
  Filled 2024-01-19: qty 1

## 2024-01-19 NOTE — ED Triage Notes (Signed)
 Patient had fall on Saturday and hit face and right knee. Reports his right knee popped and caused him to fall. Front, bottom teeth got knocked out and has two loose teeth.   Patient has jaw pain and right knee pain

## 2024-01-19 NOTE — Discharge Instructions (Addendum)
 Your xrays today are okay and do not show any fractures.  If one of your remaining teeth come out before you are seen by dentistry, follow these instructions: Gently put the tooth back into its socket, if you can. After it's back in place, help hold it in place by gently biting on something soft like gauze, a handkerchief, or a napkin. If you aren't able to put the tooth back into its socket: Do not let the tooth dry out. Immediately place the tooth in a glass of milk. If milk is not available, use your spit by spitting into a glass. If milk or spit aren't available, use water. Bring the tooth with you to the dentist

## 2024-01-19 NOTE — ED Provider Notes (Signed)
 [facility] Provider Note    Event Date/Time   First MD Initiated Contact with Patient 01/19/24 1038     (approximate)   History   Chief Complaint: Knee Pain and Jaw Pain   HPI  Randy Jordan is a 63 y.o. male with no significant past medical history who comes ED complaining of facial pain and right knee pain.  Reports that he had a previous right knee injury that causes his knee to sometimes give out on him.  2 days ago he was on his porch when his right knee hurt and caused him to fall.  He hit his face on the hood of his car, completely avulsing to teeth and leading to teeth very loose.  Denies neck pain or headache or vision change, no paresthesias.  Has persistent pain in the jaw which is made it difficult to eat.  Does not have a dentist.        Past Medical History:  Diagnosis Date   Bulging lumbar disc    Chronic back pain    Cocaine abuse (HCC)    Lumbar vertebral fracture (HCC)    Syncope 03/2016   a. felt to be 2/2 dehyration    Current Outpatient Rx   Order #: [id number] Class: Normal   Order #: [id number] Class: Normal   Order #: [id number] Class: Print    Past Surgical History:  Procedure Laterality Date   ARM WOUND REPAIR / CLOSURE Right    arm fracture repair   CHOLECYSTECTOMY     FRACTURE SURGERY     Right arm    Physical Exam   Triage Vital Signs: ED Triage Vitals  Encounter Vitals Group     BP 01/19/24 0955 (!) 169/103     Girls Systolic BP Percentile --      Girls Diastolic BP Percentile --      Boys Systolic BP Percentile --      Boys Diastolic BP Percentile --      Pulse Rate 01/19/24 0955 84     Resp 01/19/24 0955 17     Temp 01/19/24 0955 97.9 F (36.6 C)     Temp Source 01/19/24 0955 Oral     SpO2 01/19/24 0955 97 %     Weight 01/19/24 0956 220 lb (99.8 kg)     Height 01/19/24 0956 6' (1.829 m)     Head Circumference --      Peak Flow --      Pain Score 01/19/24 1006 10     Pain Loc --      Pain  Education --      Exclude from Growth Chart --     Most recent vital signs: Vitals:   01/19/24 0955  BP: (!) 169/103  Pulse: 84  Resp: 17  Temp: 97.9 F (36.6 C)  SpO2: 97%    General: Awake, no distress.  CV:  Good peripheral perfusion.  Regular rate rhythm Resp:  Normal effort.  Abd:  No distention.  Soft nontender Other:  Head atraumatic.  No C-spine tenderness.  Full range of motion.  Oral exam reveals poor dentition.  There is a left upper tooth and a left lower tooth which are partially avulsed but remain adhered to the gingiva.  No bleeding.  No intact adjacent teeth. Right knee has a mild effusion.  No bony point tenderness.  McMurray test elicits pain.  Knee joint is stable.  Quadricep and patellar tendon intact.   ED Results / Procedures /  Treatments   Labs (all labs ordered are listed, but only abnormal results are displayed) Labs Reviewed - No data to display   EKG    RADIOLOGY X-ray right knee interpreted by me, no fracture.  Radiology report reviewed X-ray mandible negative for jaw fracture   PROCEDURES:  Procedures   MEDICATIONS ORDERED IN ED: Medications  oxyCODONE -acetaminophen  (PERCOCET/ROXICET) 5-325 MG per tablet 1 tablet (has no administration in time range)     IMPRESSION / MDM / ASSESSMENT AND PLAN / ED COURSE  I reviewed the triage vital signs and the nursing notes.  DDx: Jaw fracture, traumatic dental avulsion, right knee meniscus tear, knee fracture  Patient's presentation is most consistent with acute presentation with potential threat to life or bodily function.     Clinical Course as of 01/19/24 1108  Mon Jan 19, 2024  1103 Presents with mechanical fall from right knee pain resulting in facial injury and avulsed teeth.  Unable to splint the tooth due to her dentition and few remaining teeth.  No fractures on x-rays.  Stable for discharge and follow-up with dentistry and orthopedics. [PS]    Clinical Course User Index [PS]  Viviann Pastor, MD     FINAL CLINICAL IMPRESSION(S) / ED DIAGNOSES   Final diagnoses:  Tooth avulsion, initial encounter  Effusion of right knee     Rx / DC Orders   ED Discharge Orders     None        Note:  This document was prepared using Dragon voice recognition software and may include unintentional dictation errors.   Viviann Pastor, MD 01/19/24 1115
